# Patient Record
Sex: Female | Born: 1980 | Hispanic: No | Marital: Single | Smoking: Never smoker
Health system: Southern US, Community
[De-identification: ages and names within clinical notes are randomized; demographics above are authoritative.]

## PROBLEM LIST (undated history)

## (undated) DIAGNOSIS — Z789 Other specified health status: Secondary | ICD-10-CM

## (undated) HISTORY — PX: NO PAST SURGERIES: SHX2092

---

## 2003-01-23 ENCOUNTER — Inpatient Hospital Stay (HOSPITAL_COMMUNITY): Admission: AD | Admit: 2003-01-23 | Discharge: 2003-01-23 | Payer: Self-pay | Admitting: Obstetrics & Gynecology

## 2003-05-01 ENCOUNTER — Inpatient Hospital Stay (HOSPITAL_COMMUNITY): Admission: AD | Admit: 2003-05-01 | Discharge: 2003-05-01 | Payer: Self-pay | Admitting: Obstetrics & Gynecology

## 2003-07-30 ENCOUNTER — Ambulatory Visit (HOSPITAL_COMMUNITY): Admission: RE | Admit: 2003-07-30 | Discharge: 2003-07-30 | Payer: Self-pay | Admitting: *Deleted

## 2003-09-22 ENCOUNTER — Inpatient Hospital Stay (HOSPITAL_COMMUNITY): Admission: AD | Admit: 2003-09-22 | Discharge: 2003-09-22 | Payer: Self-pay | Admitting: Obstetrics and Gynecology

## 2003-09-22 ENCOUNTER — Inpatient Hospital Stay (HOSPITAL_COMMUNITY): Admission: AD | Admit: 2003-09-22 | Discharge: 2003-09-22 | Payer: Self-pay | Admitting: *Deleted

## 2009-01-31 ENCOUNTER — Ambulatory Visit (HOSPITAL_COMMUNITY): Admission: AD | Admit: 2009-01-31 | Discharge: 2009-01-31 | Payer: Self-pay | Admitting: Obstetrics & Gynecology

## 2009-01-31 ENCOUNTER — Ambulatory Visit: Payer: Self-pay | Admitting: Obstetrics & Gynecology

## 2009-01-31 ENCOUNTER — Encounter: Payer: Self-pay | Admitting: Obstetrics & Gynecology

## 2009-01-31 ENCOUNTER — Encounter: Payer: Self-pay | Admitting: Emergency Medicine

## 2009-02-19 ENCOUNTER — Ambulatory Visit: Payer: Self-pay | Admitting: Obstetrics and Gynecology

## 2010-09-25 LAB — DIFFERENTIAL
Basophils Absolute: 0 10*3/uL (ref 0.0–0.1)
Basophils Relative: 0 % (ref 0–1)
Eosinophils Absolute: 0.2 10*3/uL (ref 0.0–0.7)
Eosinophils Relative: 2 % (ref 0–5)
Lymphocytes Relative: 15 % (ref 12–46)
Lymphs Abs: 1.2 10*3/uL (ref 0.7–4.0)
Monocytes Absolute: 0.7 10*3/uL (ref 0.1–1.0)
Monocytes Relative: 8 % (ref 3–12)
Neutro Abs: 6.3 10*3/uL (ref 1.7–7.7)
Neutrophils Relative %: 75 % (ref 43–77)

## 2010-09-25 LAB — CBC
HCT: 38.7 % (ref 36.0–46.0)
Hemoglobin: 13.2 g/dL (ref 12.0–15.0)
MCHC: 34.1 g/dL (ref 30.0–36.0)
MCV: 92.8 fL (ref 78.0–100.0)
Platelets: 235 10*3/uL (ref 150–400)
RBC: 4.17 MIL/uL (ref 3.87–5.11)
RDW: 13.6 % (ref 11.5–15.5)
WBC: 8.4 10*3/uL (ref 4.0–10.5)

## 2010-09-26 LAB — GC/CHLAMYDIA PROBE AMP, GENITAL
Chlamydia, DNA Probe: NEGATIVE
GC Probe Amp, Genital: NEGATIVE

## 2010-09-26 LAB — CBC
HCT: 38.9 % (ref 36.0–46.0)
Hemoglobin: 12.8 g/dL (ref 12.0–15.0)
MCHC: 32.8 g/dL (ref 30.0–36.0)
MCV: 92.4 fL (ref 78.0–100.0)
Platelets: 238 10*3/uL (ref 150–400)
RBC: 4.21 MIL/uL (ref 3.87–5.11)
RDW: 13.7 % (ref 11.5–15.5)
WBC: 10.4 10*3/uL (ref 4.0–10.5)

## 2010-09-26 LAB — DIFFERENTIAL
Basophils Absolute: 0.1 10*3/uL (ref 0.0–0.1)
Basophils Relative: 1 % (ref 0–1)
Eosinophils Absolute: 0.2 10*3/uL (ref 0.0–0.7)
Eosinophils Relative: 2 % (ref 0–5)
Lymphocytes Relative: 17 % (ref 12–46)
Lymphs Abs: 1.8 10*3/uL (ref 0.7–4.0)
Monocytes Absolute: 0.7 10*3/uL (ref 0.1–1.0)
Monocytes Relative: 6 % (ref 3–12)
Neutro Abs: 7.8 10*3/uL — ABNORMAL HIGH (ref 1.7–7.7)
Neutrophils Relative %: 74 % (ref 43–77)

## 2010-09-26 LAB — ABO/RH: ABO/RH(D): A POS

## 2010-09-26 LAB — BASIC METABOLIC PANEL
BUN: 4 mg/dL — ABNORMAL LOW (ref 6–23)
CO2: 23 mEq/L (ref 19–32)
Calcium: 8.9 mg/dL (ref 8.4–10.5)
Chloride: 110 mEq/L (ref 96–112)
Creatinine, Ser: 0.46 mg/dL (ref 0.4–1.2)
GFR calc Af Amer: >60 mL/min (ref >60)
GFR calc non Af Amer: >60 mL/min (ref >60)
Glucose, Bld: 106 mg/dL — ABNORMAL HIGH (ref 70–99)
Potassium: 3.5 mEq/L (ref 3.5–5.1)
Sodium: 138 mEq/L (ref 135–145)

## 2010-09-26 LAB — WET PREP, GENITAL
Clue Cells Wet Prep HPF POC: NONE SEEN
Trich, Wet Prep: NONE SEEN
Yeast Wet Prep HPF POC: NONE SEEN

## 2010-09-26 LAB — POCT PREGNANCY, URINE: Preg Test, Ur: POSITIVE

## 2010-09-26 LAB — HCG, QUANTITATIVE, PREGNANCY: hCG, Beta Chain, Quant, S: 3362 m[IU]/mL — ABNORMAL HIGH (ref <5)

## 2010-11-02 NOTE — Op Note (Signed)
NAMEDESTYNIE, TOOMEY ACCOUNT NO.:  [id number]   MEDICAL RECORD NO.:  [id number]          PATIENT TYPE:  AMB   LOCATION:  SDC                           FACILITY:  [facility]   PHYSICIAN:  Scheryl Darter, MD       DATE OF BIRTH:  02/18/1981   DATE OF PROCEDURE:  01/31/2009  DATE OF DISCHARGE:  01/31/2009                               OPERATIVE REPORT   PROCEDURE:  Suction dilatation and curettage.   PREOPERATIVE DIAGNOSIS:  Fetal demise, 8-[redacted] weeks gestation.   POSTOPERATIVE DIAGNOSIS:  Fetal demise, 8-[redacted] weeks gestation.   SURGEON:  Scheryl Darter, MD   ANESTHESIA:  MAC with local.   SPECIMEN:  Products of conception.   ESTIMATED BLOOD LOSS:  100 mL.   COMPLICATIONS:  None.   DRAINS:  None.   COUNTS:  Correct.   HOSPITAL COURSE:  The patient gave written consent for suction  dilatation and curettage due to diagnosis of a fetal demise, 8-[redacted] weeks  gestation.  The patient identification was confirmed and she was brought  to the OR and the patient received IV sedation.  Perineum and vagina was  sterilely prepped and draped.  Exam revealed about an 9-week size  uterus, revealed a closed cervix.  Speculum was inserted and 0.5%  Marcaine with 1:200,000 epinephrine was infiltrated for a intracervical  block.  Cervix was grasped with single-tooth tenaculum.  The patient  received IV Pitocin during the procedure.  Uterus sounded to 10 cm.  Cervix dilated sufficiently to pass a 9 cm suction curette.  Suction  curettage was done and products of conception were obtained.  Curettage  was completed when complete evacuation of the uterine cavity was  assured.  There was minimal bleeding at the end of the procedure.  All  instruments were removed.  The patient tolerated the procedure well  without complications.  She was brought in stable condition to the  recovery room.  The patient's blood type is A+.       Scheryl Darter, MD  Electronically Signed    JA/MEDQ  D:  01/31/2009   T:  02/01/2009  Job:  [id number]

## 2011-11-01 ENCOUNTER — Encounter (INDEPENDENT_AMBULATORY_CARE_PROVIDER_SITE_OTHER): Payer: Self-pay

## 2011-11-17 ENCOUNTER — Ambulatory Visit (INDEPENDENT_AMBULATORY_CARE_PROVIDER_SITE_OTHER): Payer: Self-pay | Admitting: General Surgery

## 2011-11-17 VITALS — BP 110/68 | HR 70 | Temp 97.3°F | Resp 16 | Ht <= 58 in | Wt 124.4 lb

## 2011-11-17 DIAGNOSIS — N644 Mastodynia: Secondary | ICD-10-CM

## 2011-11-17 NOTE — Progress Notes (Signed)
Patient ID: Tina Carson, female   DOB: [birth year], 31 y.o.   MRN: [id number]  Chief Complaint  Patient presents with  . Mass    Evaluate right breast mass    HPI Tina Carson is a 31 y.o. female.   HPI This patient was referred by Hyacinth Meeker for evaluation of right breast pain and arm pain. She says that she has noted a mass in the outer quadrant of her breast for about 7-8 months but she says that this has not increased in size over this time period. She has had 2 ultrasounds of the area which demonstrate no suspicious masses. Her most recent ultrasound was February 25 which was BI-RADS one. She says that this mass fluctuates in size  And with her periods. She says this is also causes tenderness in her breast but shooting down her arm as well. She says that she does do self breast exams and has not noticed any other masses. She denies taking any hormones or oral contraceptives. Denies any family history of breast cancer and she began her periods at age 54 she did breast-feed with the birth of her child 8 years ago. She denies any other systemic symptoms.  No past medical history on file.  No past surgical history on file.  No family history on file.  Social History History  Substance Use Topics  . Smoking status: Not on file  . Smokeless tobacco: Not on file  . Alcohol Use: Not on file    Allergies not on file  No current outpatient prescriptions on file.    Review of Systems Review of Systems All other review of systems negative or noncontributory except as stated in the HPI  Blood pressure 110/68, pulse 70, temperature 97.3 F (36.3 C), temperature source Temporal, resp. rate 16, height 4\' 9"  (1.448 m), weight 124 lb 6.4 oz (56.427 kg).  Physical Exam Physical Exam  Nursing note and vitals reviewed. Constitutional: She is oriented to person, place, and time. She appears well-developed and well-nourished. No distress.  HENT:  Head:  Normocephalic and atraumatic.  Eyes: Conjunctivae are normal. Pupils are equal, round, and reactive to light.  Neck: Normal range of motion. No tracheal deviation present.  Cardiovascular: Normal rate.   Pulmonary/Chest: Effort normal. No respiratory distress.       Breast exam is essentially normal today. Left breast demonstrates fibrous breast tissue without any dominant masses. She has no lymphadenopathy or skin changes. Her right breast is fibrous as well she does have some nodularity in the outer quadrant of her right breast in the 9:00 10:00 positions in the area of concern but I do not appreciate any dominant masses, skin changes, or lymphadenopathy.  Abdominal: Soft. She exhibits no distension. There is no tenderness.  Musculoskeletal: Normal range of motion. She exhibits no edema.  Neurological: She is alert and oriented to person, place, and time.  Skin: Skin is warm and dry. She is not diaphoretic.  Psychiatric: She has a normal mood and affect. Her behavior is normal. Judgment and thought content normal.    Data Reviewed [place]   Assessment    Fibrocystic changes of the breast I do not appreciate any suspicious masses on exam and ultrasound performed in February was BI-RAD one. I think that her breast pain and nodularity is likely due to fibrocystic changes. I do not see any need for any surgical excision at this time. I recommended that we keep this with monthly self breast exams and followup  ultrasound and clinical exam in 4 months. If everything is stable at this time, and she should not need any further followup other than monthly self breast exams and her annual mammograms starting at age 24. Certainly, if things change in the mass increases in size and becomes more defined than I recommended that she come back sooner for repeat evaluation. Otherwise, evening primrose oil and vitamin E. may be helpful as well as a supportive bra.    Plan    Continue monthly self breast exams to  see her back in 4 months with repeat ultrasound and clinical exam and       Retta Pitcher DAVID 11/17/2011, 9:35 AM

## 2012-01-03 ENCOUNTER — Encounter (INDEPENDENT_AMBULATORY_CARE_PROVIDER_SITE_OTHER): Payer: Self-pay | Admitting: General Surgery

## 2013-08-09 DIAGNOSIS — R109 Unspecified abdominal pain: Secondary | ICD-10-CM | POA: Insufficient documentation

## 2013-08-09 DIAGNOSIS — O209 Hemorrhage in early pregnancy, unspecified: Secondary | ICD-10-CM | POA: Insufficient documentation

## 2013-08-10 ENCOUNTER — Encounter (HOSPITAL_COMMUNITY): Payer: Self-pay

## 2013-08-10 ENCOUNTER — Inpatient Hospital Stay (HOSPITAL_COMMUNITY)
Admission: AD | Admit: 2013-08-10 | Discharge: 2013-08-10 | Disposition: A | Discharge status: Home / Self Care | Payer: Self-pay | Source: Ambulatory Visit | Attending: Obstetrics & Gynecology | Admitting: Obstetrics & Gynecology

## 2013-08-10 ENCOUNTER — Inpatient Hospital Stay (HOSPITAL_COMMUNITY): Payer: Self-pay

## 2013-08-10 DIAGNOSIS — O209 Hemorrhage in early pregnancy, unspecified: Secondary | ICD-10-CM

## 2013-08-10 HISTORY — DX: Other specified health status: Z78.9

## 2013-08-10 LAB — URINALYSIS, ROUTINE W REFLEX MICROSCOPIC
Bilirubin Urine: NEGATIVE
Glucose, UA: NEGATIVE mg/dL
Ketones, ur: NEGATIVE mg/dL
Leukocytes, UA: NEGATIVE
Nitrite: NEGATIVE
Protein, ur: NEGATIVE mg/dL
Specific Gravity, Urine: <1.005 — ABNORMAL LOW (ref 1.005–1.030)
Urobilinogen, UA: 0.2 mg/dL (ref 0.0–1.0)
pH: 6.5 (ref 5.0–8.0)

## 2013-08-10 LAB — URINE MICROSCOPIC-ADD ON

## 2013-08-10 LAB — CBC
HCT: 38.5 % (ref 36.0–46.0)
Hemoglobin: 12.9 g/dL (ref 12.0–15.0)
MCH: 30 pg (ref 26.0–34.0)
MCHC: 33.5 g/dL (ref 30.0–36.0)
MCV: 89.5 fL (ref 78.0–100.0)
Platelets: 222 10*3/uL (ref 150–400)
RBC: 4.3 MIL/uL (ref 3.87–5.11)
RDW: 13.7 % (ref 11.5–15.5)
WBC: 8.8 10*3/uL (ref 4.0–10.5)

## 2013-08-10 LAB — WET PREP, GENITAL
Trich, Wet Prep: NONE SEEN
Yeast Wet Prep HPF POC: NONE SEEN

## 2013-08-10 LAB — GC/CHLAMYDIA PROBE AMP
CT Probe RNA: NEGATIVE
GC Probe RNA: NEGATIVE

## 2013-08-10 LAB — HCG, QUANTITATIVE, PREGNANCY: hCG, Beta Chain, Quant, S: 3423 m[IU]/mL — ABNORMAL HIGH (ref <5)

## 2013-08-10 LAB — POCT PREGNANCY, URINE: Preg Test, Ur: POSITIVE — AB

## 2013-08-10 NOTE — Discharge Instructions (Signed)
Vaginal Bleeding During Pregnancy, First Trimester A small amount of bleeding (spotting) from the vagina is relatively common in early pregnancy. It usually stops on its own. Various things may cause bleeding or spotting in early pregnancy. Some bleeding may be related to the pregnancy, and some may not. In most cases, the bleeding is normal and is not a problem. However, bleeding can also be a sign of something serious. Be sure to tell your health care provider about any vaginal bleeding right away. Some possible causes of vaginal bleeding during the first trimester include:  Infection or inflammation of the cervix.  Growths (polyps) on the cervix.  Miscarriage or threatened miscarriage.  Pregnancy tissue has developed outside of the uterus and in a fallopian tube (tubal pregnancy).  Tiny cysts have developed in the uterus instead of pregnancy tissue (molar pregnancy). HOME CARE INSTRUCTIONS  Watch your condition for any changes. The following actions may help to lessen any discomfort you are feeling:  Follow your health care provider's instructions for limiting your activity. If your health care provider orders bed rest, you may need to stay in bed and only get up to use the bathroom. However, your health care provider may allow you to continue light activity.  If needed, make plans for someone to help with your regular activities and responsibilities while you are on bed rest.  Keep track of the number of pads you use each day, how often you change pads, and how soaked (saturated) they are. Write this down.  Do not use tampons. Do not douche.  Do not have sexual intercourse or orgasms until approved by your health care provider.  If you pass any tissue from your vagina, save the tissue so you can show it to your health care provider.  Only take over-the-counter or prescription medicines as directed by your health care provider.  Do not take aspirin because it can make you  bleed.  Keep all follow-up appointments as directed by your health care provider. SEEK MEDICAL CARE IF:  You have any vaginal bleeding during any part of your pregnancy.  You have cramps or labor pains. SEEK IMMEDIATE MEDICAL CARE IF:   You have severe cramps in your back or belly (abdomen).  You have a fever, not controlled by medicine.  You pass large clots or tissue from your vagina.  Your bleeding increases.  You feel lightheaded or weak, or you have fainting episodes.  You have chills.  You are leaking fluid or have a gush of fluid from your vagina.  You pass out while having a bowel movement. MAKE SURE YOU:  Understand these instructions.  Will watch your condition.  Will get help right away if you are not doing well or get worse. Document Released: 03/16/2005 Document Revised: 03/27/2013 Document Reviewed: 02/11/2013 [facility] Patient Information 2014 [place], [place].  Hemorragia vaginal durante el embarazo (primer trimestre) (Vaginal Bleeding During Pregnancy, First Trimester) Durante los primeros meses del embarazo es relativamente frecuente que se presente una pequea hemorragia (manchas). Esta situacin generalmente mejora por s misma. Estas hemorragias o manchas tienen diversas causas al inicio del embarazo. Algunas manchas pueden estar relacionadas al [employer] y otras no. En la [employer], la hemorragia es normal y no es un problema. Sin embargo, la hemorragia tambin puede ser un signo de algo grave. Debe informar a su mdico de inmediato si tiene alguna hemorragia vaginal. Algunas causas posibles de hemorragia vaginal durante el primer trimestre incluyen:  Infeccin o inflamacin del cuello del  tero.  Crecimientos anormales (plipos) en el cuello del tero.  Aborto espontneo o amenaza de aborto espontneo.  Tejido del Psychiatristembarazo se ha desarrollado fuera del tero y en una trompa de falopio (embarazo ectpico).  Se han desarrollado pequeos  quistes en el tero en lugar de tejido de embarazo (embarazo molar). INSTRUCCIONES PARA EL CUIDADO EN EL HOGAR  Controle su afeccin para ver si hay cambios. Las siguientes indicaciones ayudarn a Psychologist, educationalaliviar cualquier Longs Drug Storesmolestia que pueda sentir:  Siga las indicaciones del mdico para restringir su actividad. Si el mdico le indica descanso en la cama, debe quedarse en la cama y levantarse solo para ir al bao. No obstante, el mdico puede permitirle que continu con tareas livianas.  Si es necesario, organcese para que alguien le ayude con las actividades y responsabilidades cotidianas mientras est en cama.  Lleve un registro de la cantidad y la saturacin de las toallas higinicas que Landscape architectutiliza cada da. Anote este dato.  No use tampones.No se haga duchas vaginales.  No tenga relaciones sexuales u orgasmos hasta que el mdico la autorice.  Si elimina tejido por la vagina, gurdelo para mostrrselo al American Expressmdico.  [place] solo medicamentos de venta libre o recetados, segn las indicaciones del mdico.  No tome aspirina, ya que puede causar hemorragias.  Cumpla con todas las visitas de control, segn le indique su mdico. SOLICITE ATENCIN MDICA SI:  Tiene un sangrado vaginal en cualquier momento del embarazo.  Tiene calambres o dolores de [place]. SOLICITE ATENCIN MDICA DE INMEDIATO SI:   Siente calambres intensos en la espalda o en el vientre (abdomen).  Tiene fiebre que los medicamentos no Sports coachpueden controlar.  Elimina cogulos grandes o tejido por la vagina.  La hemorragia aumenta.  Si se siente mareada, dbil o se desmaya.  Tiene escalofros.  Tiene una prdida importante o sale lquido a borbotones por la vagina.  Se desmaya mientras defeca. ASEGRESE DE QUE:  Comprende estas instrucciones.  Controlar su afeccin.  Recibir ayuda de inmediato si no mejora o si empeora. Document Released: 03/16/2005 Document Revised: 03/27/2013 [facility] Patient Information 2014 [place].

## 2013-08-10 NOTE — MAU Note (Signed)
Took home UPT 2 weeks ago and it was positive.Started having abd pain 2 hours ago and then 40 min ago, had pinkish d/c.

## 2013-08-10 NOTE — MAU Provider Note (Signed)
Chief Complaint: Abdominal Pain   None    SUBJECTIVE HPI: Tina Carson is a 33 y.o. G3P1011 at 2737w2d by LMP who presents to maternity admissions reporting positive pregnancy test 2 weeks ago, with abdominal cramping and pink spotting starting today.  She denies vaginal itching/burning, urinary symptoms, h/a, dizziness, n/v, or fever/chills.     Past Medical History  Diagnosis Date  . Medical history non-contributory    Past Surgical History  Procedure Laterality Date  . No past surgeries     History   Social History  . Marital Status: Married    Spouse Name: N/A    Number of Children: N/A  . Years of Education: N/A   Occupational History  . Not on file.   Social History Main Topics  . Smoking status: Never Smoker   . Smokeless tobacco: Never Used  . Alcohol Use: No  . Drug Use: No  . Sexual Activity: Yes   Other Topics Concern  . Not on file   Social History Narrative  . No narrative on file   No current facility-administered medications on file prior to encounter.   No current outpatient prescriptions on file prior to encounter.   No Known Allergies  ROS: Pertinent items in HPI  OBJECTIVE Blood pressure 95/56, pulse 75, temperature 98.8 F (37.1 C), temperature source Oral, resp. rate 18, height 4\' 9"  (1.448 m), weight 59.875 kg (132 lb), last menstrual period 05/23/2013. GENERAL: Well-developed, well-nourished female in no acute distress.  HEENT: Normocephalic HEART: normal rate RESP: normal effort ABDOMEN: Soft, non-tender EXTREMITIES: Nontender, no edema NEURO: Alert and oriented Pelvic exam: Cervix pink, visually closed, without lesion, small amount dark red blood in vaginal vault, vaginal walls and external genitalia normal Bimanual exam: Cervix 0/long/high, firm, anterior, neg CMT, uterus nontender, nonenlarged, adnexa without tenderness, enlargement, or mass  LAB RESULTS URINALYSIS, ROUTINE W REFLEX MICROSCOPIC   Collection Time     08/10/13 12:00 AM      Result Value Ref Range   Color, Urine YELLOW  YELLOW   APPearance CLEAR  CLEAR   Specific Gravity, Urine <1.005 (*) 1.005 - 1.030   pH 6.5  5.0 - 8.0   Glucose, UA NEGATIVE  NEGATIVE mg/dL   Hgb urine dipstick LARGE (*) NEGATIVE   Bilirubin Urine NEGATIVE  NEGATIVE   Ketones, ur NEGATIVE  NEGATIVE mg/dL   Protein, ur NEGATIVE  NEGATIVE mg/dL   Urobilinogen, UA 0.2  0.0 - 1.0 mg/dL   Nitrite NEGATIVE  NEGATIVE   Leukocytes, UA NEGATIVE  NEGATIVE  URINE MICROSCOPIC-ADD ON   Collection Time    08/10/13 12:00 AM      Result Value Ref Range   Squamous Epithelial / LPF RARE  RARE   WBC, UA 0-2  <3 WBC/hpf   RBC / HPF 3-6  <3 RBC/hpf   Bacteria, UA FEW (*) RARE   Urine-Other AMORPHOUS URATES/PHOSPHATES    WET PREP, GENITAL   Collection Time    08/10/13 12:45 AM      Result Value Ref Range   Yeast Wet Prep HPF POC NONE SEEN  NONE SEEN   Trich, Wet Prep NONE SEEN  NONE SEEN   Clue Cells Wet Prep HPF POC FEW (*) NONE SEEN   WBC, Wet Prep HPF POC MODERATE (*) NONE SEEN  GC/CHLAMYDIA PROBE AMP   Collection Time    08/10/13 12:45 AM      Result Value Ref Range   CT Probe RNA NEGATIVE  NEGATIVE  GC Probe RNA NEGATIVE  NEGATIVE  CBC   Collection Time    08/10/13 12:55 AM      Result Value Ref Range   WBC 8.8  4.0 - 10.5 K/uL   RBC 4.30  3.87 - 5.11 MIL/uL   Hemoglobin 12.9  12.0 - 15.0 g/dL   HCT 16.1  09.6 - 04.5 %   MCV 89.5  78.0 - 100.0 fL   MCH 30.0  26.0 - 34.0 pg   MCHC 33.5  30.0 - 36.0 g/dL   RDW 40.9  81.1 - 91.4 %   Platelets 222  150 - 400 K/uL  HCG, QUANTITATIVE, PREGNANCY   Collection Time    08/10/13 12:55 AM      Result Value Ref Range   hCG, Beta Chain, Quant, S 3423 (*) <5 mIU/mL  POCT PREGNANCY, URINE   Collection Time    08/10/13 12:59 AM      Result Value Ref Range   Preg Test, Ur POSITIVE (*) NEGATIVE     IMAGING CLINICAL DATA: Vaginal bleeding.  EXAM:  OBSTETRIC <14 WK ULTRASOUND  TRANSVAGINAL OB ULTRASOUND   TECHNIQUE:  Transabdominal and transvaginal ultrasound was performed for  evaluation of the gestation as well as the maternal uterus and  adnexal regions.  COMPARISON: Prior ultrasound of pregnancy performed 01/31/2009  FINDINGS:  Intrauterine gestational sac: Visualized/normal in shape.  Yolk sac: Yes  Embryo: No  Cardiac Activity: N/A  MSD: 8.2 mm 5 w 3 d  Maternal uterus/adnexae: No subchorionic hemorrhage is noted. The  uterus is otherwise unremarkable in appearance.  The ovaries are unremarkable in appearance. The right ovary measures  2.8 x 1.4 x 1.7 cm, while the left ovary measures 2.8 x 1.4 x 1.8  cm. No suspicious adnexal masses are seen; there is no evidence for  ovarian torsion.  No free fluid is seen in the pelvic cul-de-sac.  IMPRESSION:  Single intrauterine gestational sac noted, with an associated yolk  sac. The embryo is not yet seen. The mean sac diameter of 8 mm  corresponds to a gestational age of [redacted] weeks 3 days; this does not  match the gestational age by LMP, and reflects an estimated date of  delivery of April 09, 2014.  Electronically Signed  By: Roanna Raider M.D.  On: 08/10/2013 01:35    ASSESSMENT 1. Vaginal bleeding in pregnant patient at less than [redacted] weeks gestation     PLAN Discharge home with bleeding precautions F/U with early prenatal care Return to MAU as needed    Medication List    Notice   You have not been prescribed any medications.     Follow-up Information   Follow up with [facility] [place]. (Or prenatal provider of your choice.)    Contact information:   [address] [place] [address] [phone]      Follow up with [facility] [place] [facility]. (If symptoms worsen)    Contact information:   [address] [id number] [place] [address] [phone]      Sharen Counter Certified Nurse-Midwife 08/12/2013  7:10 PM

## 2013-08-11 ENCOUNTER — Inpatient Hospital Stay (HOSPITAL_COMMUNITY)
Admission: AD | Admit: 2013-08-11 | Discharge: 2013-08-12 | Disposition: A | Discharge status: Home / Self Care | Payer: Self-pay | Source: Ambulatory Visit | Attending: Obstetrics & Gynecology | Admitting: Obstetrics & Gynecology

## 2013-08-11 ENCOUNTER — Encounter (HOSPITAL_COMMUNITY): Payer: Self-pay | Admitting: *Deleted

## 2013-08-11 ENCOUNTER — Inpatient Hospital Stay (HOSPITAL_COMMUNITY): Payer: Self-pay

## 2013-08-11 DIAGNOSIS — O039 Complete or unspecified spontaneous abortion without complication: Secondary | ICD-10-CM | POA: Insufficient documentation

## 2013-08-11 DIAGNOSIS — R109 Unspecified abdominal pain: Secondary | ICD-10-CM | POA: Insufficient documentation

## 2013-08-11 LAB — CBC
HCT: 37.1 % (ref 36.0–46.0)
Hemoglobin: 12.6 g/dL (ref 12.0–15.0)
MCH: 30.2 pg (ref 26.0–34.0)
MCHC: 34 g/dL (ref 30.0–36.0)
MCV: 89 fL (ref 78.0–100.0)
PLATELETS: 220 10*3/uL (ref 150–400)
RBC: 4.17 MIL/uL (ref 3.87–5.11)
RDW: 13.7 % (ref 11.5–15.5)
WBC: 8.6 10*3/uL (ref 4.0–10.5)

## 2013-08-11 LAB — HCG, QUANTITATIVE, PREGNANCY: HCG, BETA CHAIN, QUANT, S: 570 m[IU]/mL — AB (ref <5)

## 2013-08-11 LAB — ABO/RH: ABO/RH(D): A POS

## 2013-08-11 NOTE — MAU Provider Note (Signed)
History     CSN: [id number]  Arrival date and time: 08/11/13 2232   First Provider Initiated Contact with Patient 08/11/13 2300      Chief Complaint  Patient presents with  . Abdominal Cramping  . Vaginal Bleeding   Abdominal Cramping  Vaginal Bleeding    Tina Carson is a 33 y.o. G3P1011 at 5998w4d who presents today with vaginal bleeding. She states that she was seen on 08/10/13 and had spotting at that time. Everything was normal at that visit (IUP with yolk sac). However, she has continued to bleed. Today is has been very heavy and she has been passing large clots.   Past Medical History  Diagnosis Date  . Medical history non-contributory     Past Surgical History  Procedure Laterality Date  . No past surgeries      Family History  Problem Relation Age of Onset  . Hypertension Maternal Grandmother     History  Substance Use Topics  . Smoking status: Never Smoker   . Smokeless tobacco: Never Used  . Alcohol Use: No    Allergies: No Known Allergies  No prescriptions prior to admission    Review of Systems  Genitourinary: Positive for vaginal bleeding.   Physical Exam   Blood pressure 104/67, pulse 67, temperature 97.8 F (36.6 C), temperature source Oral, resp. rate 16, height 4\' 9"  (1.448 m), weight 59.421 kg (131 lb), last menstrual period 05/23/2013.  Physical Exam  Nursing note and vitals reviewed. Constitutional: She is oriented to person, place, and time. She appears well-developed and well-nourished. No distress.  Cardiovascular: Normal rate.   Respiratory: Effort normal.  GI: Soft. There is no tenderness.  Genitourinary:   External: no lesion Vagina: large amount of blood and clot removed from the vagina.  Cervix: pink, smooth, no CMT Uterus: NSSC Adnexa: NT   Neurological: She is alert and oriented to person, place, and time.  Skin: Skin is warm and dry.  Psychiatric: She has a normal mood and affect.    MAU Course   Procedures  Results for Enrigue CatenaRODRIGUEZ-ESPINDOLA, Artice V (MRN [id number]) as of 08/12/2013 00:23  Ref. Range 08/10/2013 00:55 08/10/2013 00:59 08/10/2013 01:21 08/11/2013 23:20  hCG, Beta Chain, Quant, S Latest Range: <5 mIU/mL 3423 (H)   570 (H)   Results for orders placed during the hospital encounter of 08/11/13 (from the past 24 hour(s))  CBC     Status: None   Collection Time    08/11/13 11:20 PM      Result Value Ref Range   WBC 8.6  4.0 - 10.5 K/uL   RBC 4.17  3.87 - 5.11 MIL/uL   Hemoglobin 12.6  12.0 - 15.0 g/dL   HCT 86.537.1  78.436.0 - 69.646.0 %   MCV 89.0  78.0 - 100.0 fL   MCH 30.2  26.0 - 34.0 pg   MCHC 34.0  30.0 - 36.0 g/dL   RDW 29.513.7  28.411.5 - 13.215.5 %   Platelets 220  150 - 400 K/uL  ABO/RH     Status: None   Collection Time    08/11/13 11:20 PM      Result Value Ref Range   ABO/RH(D) A POS    HCG, QUANTITATIVE, PREGNANCY     Status: Abnormal   Collection Time    08/11/13 11:20 PM      Result Value Ref Range   hCG, Beta Chain, Quant, S 570 (*) <5 mIU/mL   D/W the patient and her husband at  length that she has had a miscarriage, and has lost the baby. Reviewed bleeding precautions and when to return to MAU. Otherwise will FU in the clinic in 2 weeks for FU HCG. Comfort pack given.   Assessment and Plan   1. SAB (spontaneous abortion)    Bleeding precautions Return to MAU as needed  Follow-up Information   Follow up with [facility] In 2 weeks.   Specialty:  Obstetrics and Gynecology   Contact information:   [address] [place] [address] [phone]       Tawnya Crook 08/11/2013, 11:42 PM

## 2013-08-11 NOTE — MAU Note (Signed)
Pt with cramping and bleeding since Friday.  Bleeding is heavier today.

## 2013-08-12 ENCOUNTER — Encounter: Payer: Self-pay | Admitting: *Deleted

## 2013-08-12 DIAGNOSIS — O039 Complete or unspecified spontaneous abortion without complication: Secondary | ICD-10-CM

## 2013-08-12 MED ORDER — HYDROCODONE-ACETAMINOPHEN 5-325 MG PO TABS: 1.0000 | ORAL_TABLET | Freq: Four times a day (QID) | ORAL | Status: DC | PRN

## 2013-08-12 MED ORDER — PROMETHAZINE HCL 12.5 MG PO TABS: 12.5000 mg | ORAL_TABLET | Freq: Four times a day (QID) | ORAL | Status: DC | PRN

## 2013-08-12 NOTE — Discharge Instructions (Signed)
Aborto espontáneo  °(Miscarriage) °El aborto espontáneo es la pérdida de un bebé que no ha nacido (feto) antes de la semana 20 del embarazo. La mayor parte de estos abortos ocurre en los primeros 3 meses. En algunos casos ocurre antes de que la mujer sepa que está embarazada. También se denomina "aborto espontáneo" o "pérdida prematura del embarazo". El aborto espontáneo puede ser una experiencia que afecte emocionalmente a la persona. Converse con su médico si tiene dudas, cómo es el proceso de duelo, y sobre planes futuros de embarazo.  °CAUSAS  °· Algunos problemas cromosómicos pueden hacer imposible que el bebé se desarrolle normalmente. Los problemas con los genes o cromosomas del bebé son generalmente el resultado de errores que se producen, por casualidad, cuando el embrión se divide y crece. Estos problemas no se heredan de los padres. °· Infección en el cuello del útero.   °· Problemas hormonales.   °· Problemas en el cuello del útero, como tener un útero incompetente. Esto ocurre cuando los tejidos no son lo suficientemente fuertes como para contener el embarazo.   °· Problemas del útero, como un útero con forma anormal, los fibromas o anormalidades congénitas.   °· Ciertas enfermedades crónicas.   °· No fume, no beba alcohol, ni consuma drogas.   °· Traumatismos   °A veces, la causa es desconocida.  °SÍNTOMAS  °· Sangrado o manchado vaginal, con o sin cólicos o dolor. °· Dolor o cólicos en el abdomen o en la cintura. °· Eliminación de líquido, tejidos o coágulos grandes por la vagina. °DIAGNÓSTICO  °El médico le hará un examen físico. También le indicará una ecografía para confirmar el aborto. Es posible que se realicen análisis de sangre.  °TRATAMIENTO  °· En algunos casos el tratamiento no es necesario, si se eliminan naturalmente todos los tejidos embrionarios que se encontraban en el útero. Si el feto o la placenta quedan dentro del útero (aborto incompleto), pueden infectarse, los tejidos que quedan  pueden infectarse y deben retirarse. Generalmente se realiza un procedimiento de dilatación y curetaje (D y C). Durante el procedimiento de dilatación y curetaje, el cuello del útero se abre (dilata) y se retira cualquier resto de tejido fetal o placentario del útero. °· Si hay una infección, le recetarán antibióticos. Podrán recetarle otros medicamentos para reducir el tamaño del útero (contraerlo) si hay una mucho sangrado. °· Si su sangre es Rh negativa y su bebé es Rh positivo, usted necesitará la inyección de inmunoglobulina Rh. Esta inyección protegerá a los futuros bebés de tener problemas de compatibilidad Rh en futuros embarazos. °INSTRUCCIONES PARA EL CUIDADO EN EL HOGAR  °· El médico le indicará reposo en cama o le permitirá realizar actividades livianas. Vuelva a la actividad lentamente o según las indicaciones de su médico. °· Pídale a alguien que la ayude con las responsabilidades familiares y del hogar durante este tiempo.   °· Lleve un registro de la cantidad y la saturación de las toallas higiénicas que utiliza cada día. Anote esta información   °· No use tampones. No No se haga duchas vaginales ni tenga relaciones sexuales hasta que el médico la autorice.   °· Sólo tome medicamentos de venta libre o recetados para calmar el dolor o el malestar, según las indicaciones de su médico.   °· No tome aspirina. La aspirina puede ocasionar hemorragias.   °· Concurra puntualmente a las citas de control con el médico.   °· Si usted o su pareja tienen dificultades con el duelo, hable con su médico para buscar la ayuda psicológica que los ayude a enfrentar la pérdida   del embarazo. Permítase el tiempo suficiente de duelo antes de quedar embarazada nuevamente.   °SOLICITE ATENCIÓN MÉDICA DE INMEDIATO SI:  °· Siente calambres intensos o dolor en la espalda o en el abdomen. °· Tiene fiebre. °· Elimina grandes coágulos de sangre (del tamaño de una nuez o más) o tejidos por la vagina. Guarde lo que ha eliminado para  que su médico lo examine.   °· La hemorragia aumenta.   °· Observa una secreción vaginal espesa y con mal olor. °· Se siente mareada, débil, o se desmaya.   °· Siente escalofríos.   °ASEGÚRESE DE QUE:  °· Comprende estas instrucciones. °· Controlará su enfermedad. °· Solicitará ayuda de inmediato si no mejora o si empeora. °Document Released: 03/16/2005 Document Revised: 10/01/2012 °ExitCare® Patient Information ©2014 [employer]. ° °

## 2013-08-20 NOTE — MAU Provider Note (Signed)
Attestation of Attending Supervision of Advanced Practitioner (CNM/NP): Evaluation and management procedures were performed by the Advanced Practitioner under my supervision and collaboration. I have reviewed the Advanced Practitioner's note and chart, and I agree with the management and plan.  Rydge Texidor H. 10:37 PM

## 2013-08-26 ENCOUNTER — Encounter: Payer: Self-pay | Admitting: Obstetrics & Gynecology

## 2013-08-26 ENCOUNTER — Ambulatory Visit (INDEPENDENT_AMBULATORY_CARE_PROVIDER_SITE_OTHER): Payer: Self-pay | Admitting: Obstetrics & Gynecology

## 2013-08-26 VITALS — BP 107/74 | HR 81 | Temp 98.0°F | Ht <= 58 in | Wt 130.5 lb

## 2013-08-26 DIAGNOSIS — O039 Complete or unspecified spontaneous abortion without complication: Secondary | ICD-10-CM

## 2013-08-26 MED ORDER — PRENATAL VITAMINS 0.8 MG PO TABS: 1.0000 | ORAL_TABLET | Freq: Every day | ORAL | Status: AC

## 2013-08-26 NOTE — Patient Instructions (Signed)
Aborto espontáneo  °(Miscarriage) °El aborto espontáneo es la pérdida de un bebé que no ha nacido (feto) antes de la semana 20 del embarazo. La mayor parte de estos abortos ocurre en los primeros 3 meses. En algunos casos ocurre antes de que la mujer sepa que está embarazada. También se denomina "aborto espontáneo" o "pérdida prematura del embarazo". El aborto espontáneo puede ser una experiencia que afecte emocionalmente a la persona. Converse con su médico si tiene dudas, cómo es el proceso de duelo, y sobre planes futuros de embarazo.  °CAUSAS  °· Algunos problemas cromosómicos pueden hacer imposible que el bebé se desarrolle normalmente. Los problemas con los genes o cromosomas del bebé son generalmente el resultado de errores que se producen, por casualidad, cuando el embrión se divide y crece. Estos problemas no se heredan de los padres. °· Infección en el cuello del útero.   °· Problemas hormonales.   °· Problemas en el cuello del útero, como tener un útero incompetente. Esto ocurre cuando los tejidos no son lo suficientemente fuertes como para contener el embarazo.   °· Problemas del útero, como un útero con forma anormal, los fibromas o anormalidades congénitas.   °· Ciertas enfermedades crónicas.   °· No fume, no beba alcohol, ni consuma drogas.   °· Traumatismos   °A veces, la causa es desconocida.  °SÍNTOMAS  °· Sangrado o manchado vaginal, con o sin cólicos o dolor. °· Dolor o cólicos en el abdomen o en la cintura. °· Eliminación de líquido, tejidos o coágulos grandes por la vagina. °DIAGNÓSTICO  °El médico le hará un examen físico. También le indicará una ecografía para confirmar el aborto. Es posible que se realicen análisis de sangre.  °TRATAMIENTO  °· En algunos casos el tratamiento no es necesario, si se eliminan naturalmente todos los tejidos embrionarios que se encontraban en el útero. Si el feto o la placenta quedan dentro del útero (aborto incompleto), pueden infectarse, los tejidos que quedan  pueden infectarse y deben retirarse. Generalmente se realiza un procedimiento de dilatación y curetaje (D y C). Durante el procedimiento de dilatación y curetaje, el cuello del útero se abre (dilata) y se retira cualquier resto de tejido fetal o placentario del útero. °· Si hay una infección, le recetarán antibióticos. Podrán recetarle otros medicamentos para reducir el tamaño del útero (contraerlo) si hay una mucho sangrado. °· Si su sangre es Rh negativa y su bebé es Rh positivo, usted necesitará la inyección de inmunoglobulina Rh. Esta inyección protegerá a los futuros bebés de tener problemas de compatibilidad Rh en futuros embarazos. °INSTRUCCIONES PARA EL CUIDADO EN EL HOGAR  °· El médico le indicará reposo en cama o le permitirá realizar actividades livianas. Vuelva a la actividad lentamente o según las indicaciones de su médico. °· Pídale a alguien que la ayude con las responsabilidades familiares y del hogar durante este tiempo.   °· Lleve un registro de la cantidad y la saturación de las toallas higiénicas que utiliza cada día. Anote esta información   °· No use tampones. No No se haga duchas vaginales ni tenga relaciones sexuales hasta que el médico la autorice.   °· Sólo tome medicamentos de venta libre o recetados para calmar el dolor o el malestar, según las indicaciones de su médico.   °· No tome aspirina. La aspirina puede ocasionar hemorragias.   °· Concurra puntualmente a las citas de control con el médico.   °· Si usted o su pareja tienen dificultades con el duelo, hable con su médico para buscar la ayuda psicológica que los ayude a enfrentar la pérdida   del embarazo. Permítase el tiempo suficiente de duelo antes de quedar embarazada nuevamente.   °SOLICITE ATENCIÓN MÉDICA DE INMEDIATO SI:  °· Siente calambres intensos o dolor en la espalda o en el abdomen. °· Tiene fiebre. °· Elimina grandes coágulos de sangre (del tamaño de una nuez o más) o tejidos por la vagina. Guarde lo que ha eliminado para  que su médico lo examine.   °· La hemorragia aumenta.   °· Observa una secreción vaginal espesa y con mal olor. °· Se siente mareada, débil, o se desmaya.   °· Siente escalofríos.   °ASEGÚRESE DE QUE:  °· Comprende estas instrucciones. °· Controlará su enfermedad. °· Solicitará ayuda de inmediato si no mejora o si empeora. °Document Released: 03/16/2005 Document Revised: 10/01/2012 °ExitCare® Patient Information ©2014 [employer]. ° °

## 2013-08-26 NOTE — Progress Notes (Signed)
Subjective:     Patient ID: Tina Carson, female   DOB: 05/10/1981, 33 y.o.   MRN: [id number]  HPI  Pt presents for eval of SAB.  She was seen in the MAU 2/21 and 2/22 and had HCG 3423 and 570 respectively.  She denies bleeding for 2 weeks.  She denies pain.  She does not want contraception as she desires to conceive.  Review of Systems     Objective:   Physical Exam BP 107/74  Pulse 81  Temp(Src) 98 F (36.7 C)  Ht 4\' 9"  (1.448 m)  Wt 130 lb 8 oz (59.194 kg)  BMI 28.23 kg/m2  LMP 05/23/2013  Breastfeeding? Unknown  Pt in NAD Abd: soft, NT, ND     Assessment:     SAB - recommend not getting pregnant for 3 months.    Plan:     Prenatal vit 1 PO q day F/u prn

## 2013-11-13 ENCOUNTER — Encounter: Payer: Self-pay | Admitting: General Practice

## 2014-03-31 ENCOUNTER — Other Ambulatory Visit (HOSPITAL_COMMUNITY): Payer: Self-pay | Admitting: Physician Assistant

## 2014-03-31 DIAGNOSIS — Z3689 Encounter for other specified antenatal screening: Secondary | ICD-10-CM

## 2014-03-31 LAB — OB RESULTS CONSOLE HIV ANTIBODY (ROUTINE TESTING)
HIV: NONREACTIVE
HIV: NONREACTIVE
HIV: NONREACTIVE

## 2014-03-31 LAB — OB RESULTS CONSOLE RPR
RPR: NONREACTIVE
RPR: NONREACTIVE
RPR: NONREACTIVE

## 2014-03-31 LAB — OB RESULTS CONSOLE GC/CHLAMYDIA
Chlamydia: NEGATIVE
GC PROBE AMP, GENITAL: NEGATIVE

## 2014-03-31 LAB — OB RESULTS CONSOLE HEPATITIS B SURFACE ANTIGEN: Hepatitis B Surface Ag: NEGATIVE

## 2014-03-31 LAB — OB RESULTS CONSOLE RUBELLA ANTIBODY, IGM: RUBELLA: IMMUNE

## 2014-04-21 ENCOUNTER — Encounter: Payer: Self-pay | Admitting: Obstetrics & Gynecology

## 2014-04-22 ENCOUNTER — Ambulatory Visit (HOSPITAL_COMMUNITY)
Admission: RE | Admit: 2014-04-22 | Discharge: 2014-04-22 | Disposition: A | Discharge status: Home / Self Care | Payer: Self-pay | Source: Ambulatory Visit | Attending: Physician Assistant | Admitting: Physician Assistant

## 2014-04-22 DIAGNOSIS — Z3A18 18 weeks gestation of pregnancy: Secondary | ICD-10-CM | POA: Insufficient documentation

## 2014-04-22 DIAGNOSIS — Z36 Encounter for antenatal screening of mother: Secondary | ICD-10-CM | POA: Insufficient documentation

## 2014-04-22 DIAGNOSIS — Z3689 Encounter for other specified antenatal screening: Secondary | ICD-10-CM | POA: Insufficient documentation

## 2014-05-12 ENCOUNTER — Other Ambulatory Visit (HOSPITAL_COMMUNITY): Payer: Self-pay | Admitting: Nurse Practitioner

## 2014-05-12 DIAGNOSIS — Z3689 Encounter for other specified antenatal screening: Secondary | ICD-10-CM

## 2014-05-20 ENCOUNTER — Ambulatory Visit (HOSPITAL_COMMUNITY)
Admission: RE | Admit: 2014-05-20 | Discharge: 2014-05-20 | Disposition: A | Discharge status: Home / Self Care | Payer: Self-pay | Source: Ambulatory Visit | Attending: Nurse Practitioner | Admitting: Nurse Practitioner

## 2014-05-20 DIAGNOSIS — Z36 Encounter for antenatal screening of mother: Secondary | ICD-10-CM | POA: Insufficient documentation

## 2014-05-20 DIAGNOSIS — Z3689 Encounter for other specified antenatal screening: Secondary | ICD-10-CM

## 2014-05-20 DIAGNOSIS — Z3A22 22 weeks gestation of pregnancy: Secondary | ICD-10-CM | POA: Insufficient documentation

## 2014-05-21 DIAGNOSIS — Z0489 Encounter for examination and observation for other specified reasons: Secondary | ICD-10-CM | POA: Insufficient documentation

## 2014-05-21 DIAGNOSIS — Z3A22 22 weeks gestation of pregnancy: Secondary | ICD-10-CM | POA: Insufficient documentation

## 2014-05-21 DIAGNOSIS — IMO0002 Reserved for concepts with insufficient information to code with codable children: Secondary | ICD-10-CM | POA: Insufficient documentation

## 2014-06-20 NOTE — L&D Delivery Note (Signed)
Patient is 34 y.o. [id number] 6968w3d admitted for onset of labor, no pertinent medical hx   Delivery Note At 6:04 PM a viable female was delivered via  (Presentation: occiput anterior).  APGAR: 7, 9; weight  pending.   Placenta status: intact.  Cord: 3 vessels.   Anesthesia: epidural  Episiotomy:  n/a Lacerations:  Small superficial tear Suture Repair: none Est. Blood Loss (mL): 250cc     NICU provider at bedside in setting of meconium stained fluid.  Baby immediately placed under warmer and bulb suctioned.  Please see NICU note. Mom to postpartum.  Baby to Couplet care / Skin to Skin. Placenta to: BS Feeding: Breast Circ: None Contraception: Ermelinda DasUndecided   Gottschalk, Ashly M, DO 09/25/2014, 6:43 PM  I was present for the entire delivery of baby and placenta and agree with above.  [place] Te[place] M[place], [place] 09/25/2014 7:09 PM

## 2014-08-20 IMAGING — US US OB COMP LESS 14 WK
1 series · 14 of 28 positions shown · non-contrast
Comparison: Prior ultrasound of pregnancy performed 01/31/2009

CLINICAL DATA: Vaginal bleeding.

EXAM:
OBSTETRIC <14 WK ULTRASOUND
TRANSVAGINAL OB ULTRASOUND
TECHNIQUE: Transabdominal and transvaginal ultrasound was performed for
evaluation of the gestation as well as the maternal uterus and
adnexal regions.

[Series 1: us ob comp less 14 wks · 14 of 48 slices shown]
[im 2/48]
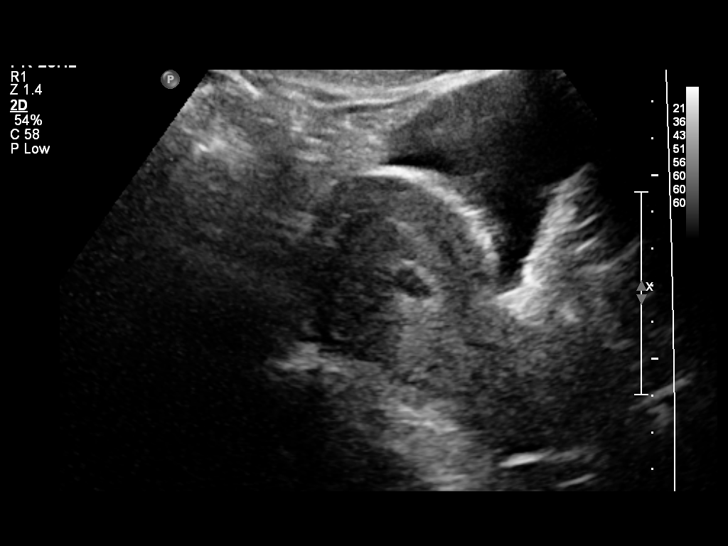
[im 6/48]
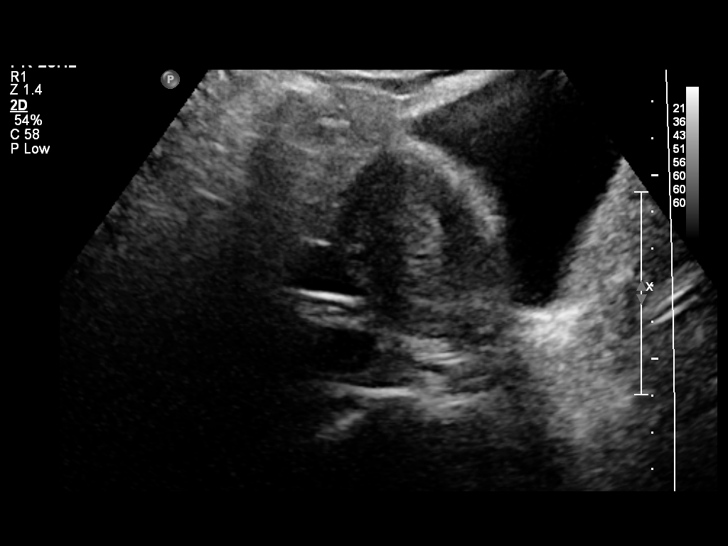
[im 9/48]
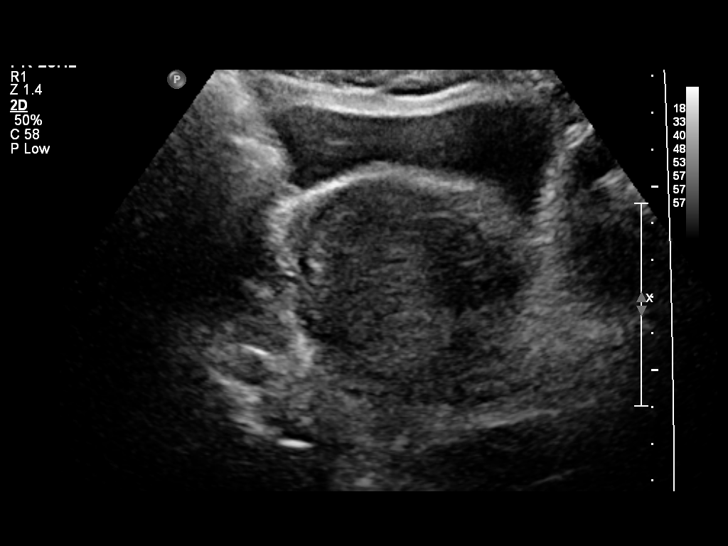
[im 13/48]
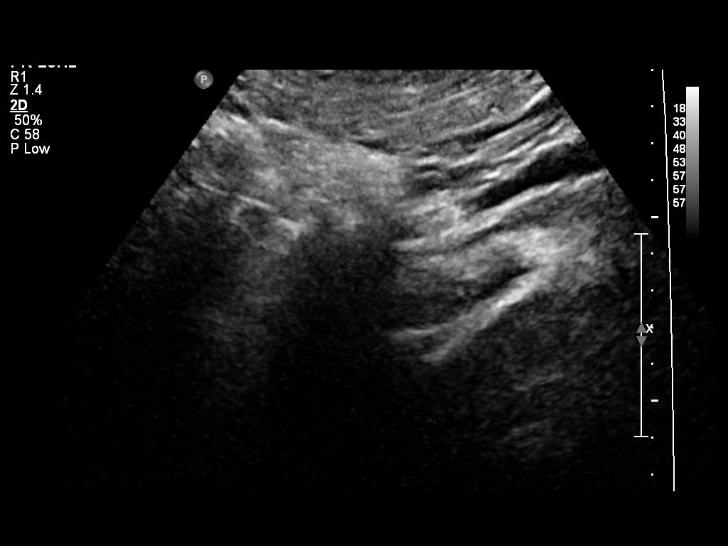
[im 16/48]
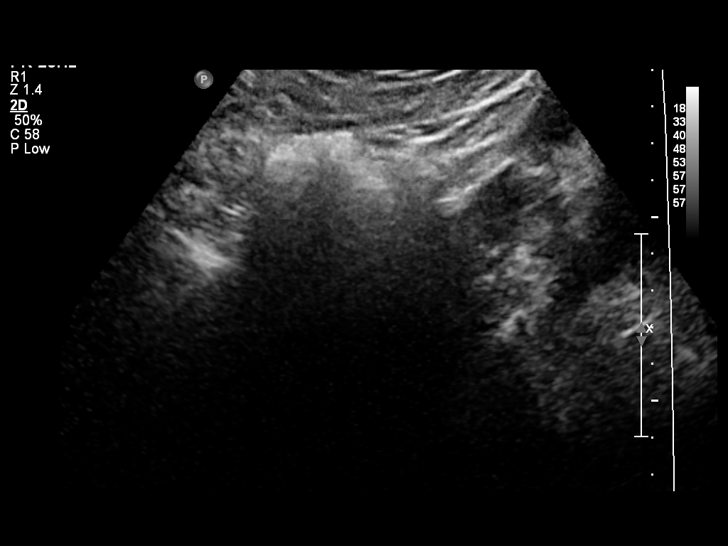
[im 20/48]
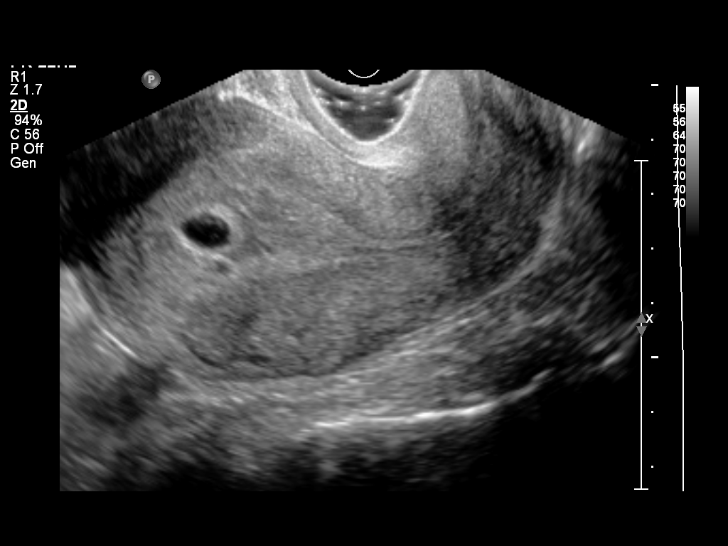
[im 23/48]
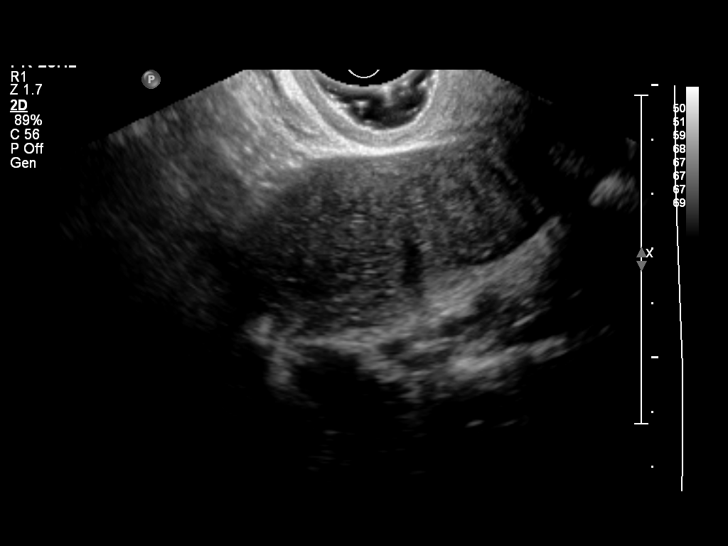
[im 27/48]
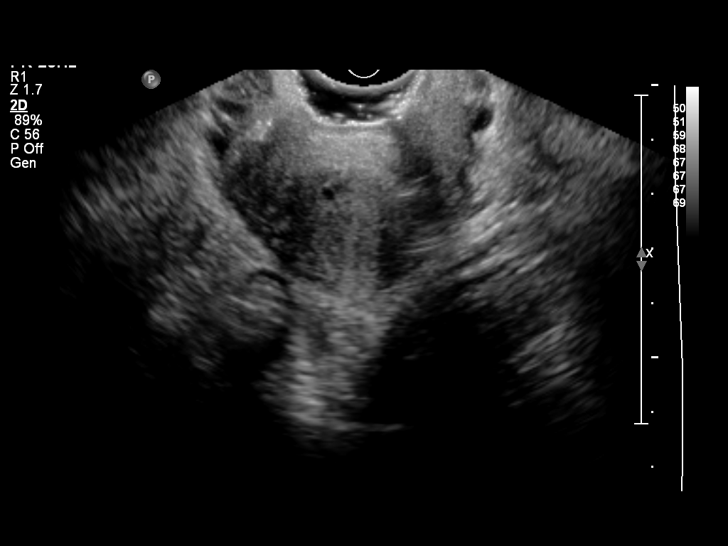
[im 30/48]
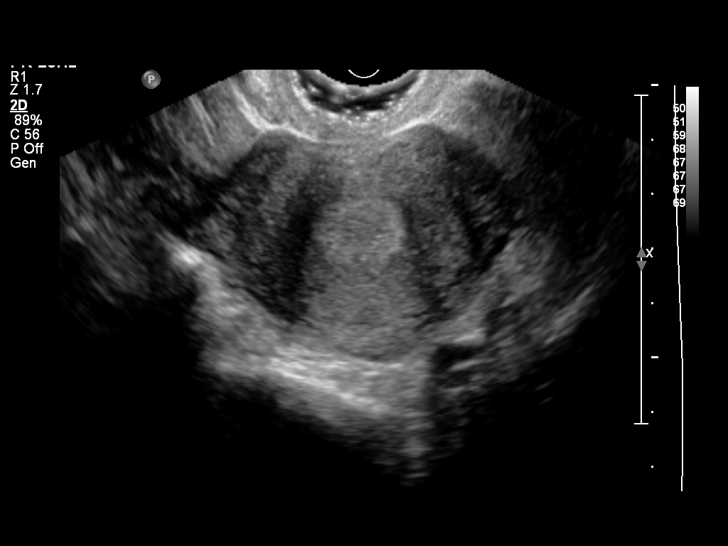
[im 34/48]
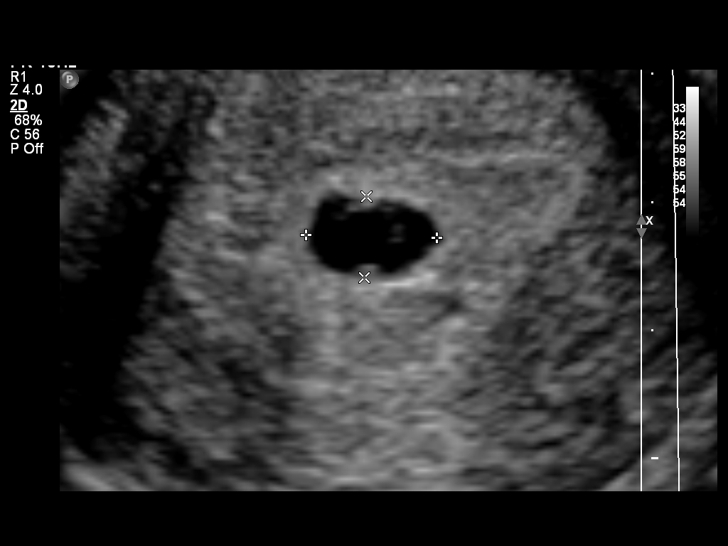
[im 37/48]
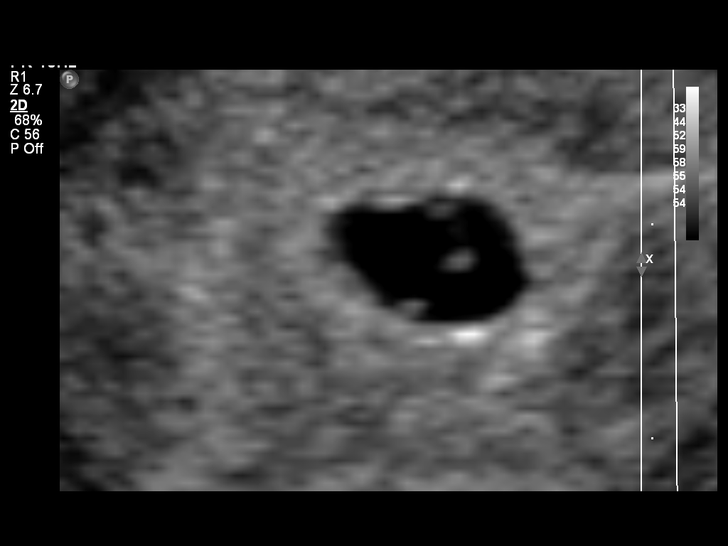
[im 41/48]
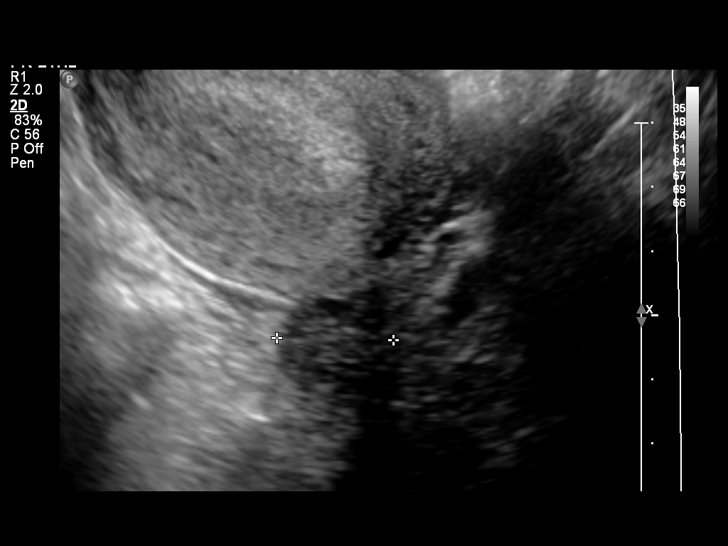
[im 44/48]
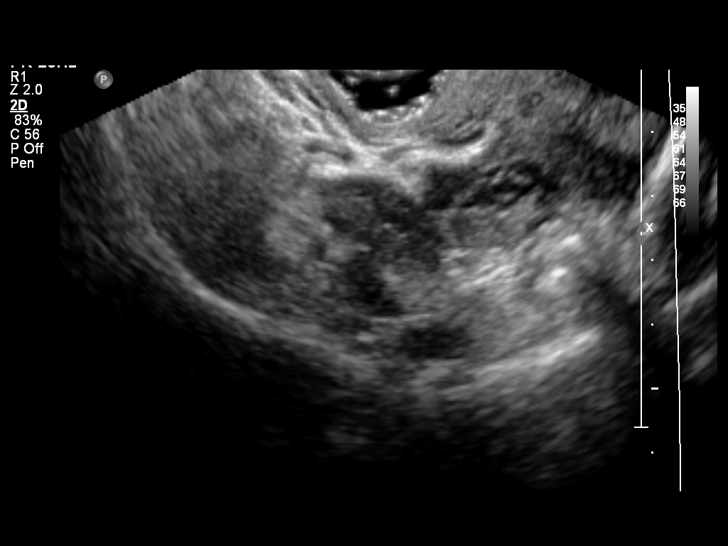
[im 48/48]
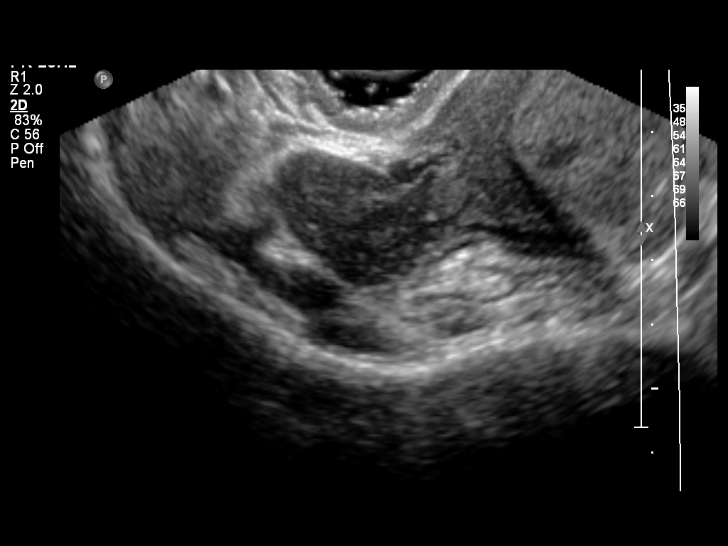

[14 of 28 positions shown; findings below may reference images not displayed]

FINDINGS: Intrauterine gestational sac: Visualized/normal in shape.

Yolk sac:  Yes

Embryo:  No

Cardiac Activity: N/A

MSD: 8.2  mm   5 w   3  d

Maternal uterus/adnexae: No subchorionic hemorrhage is noted. The
uterus is otherwise unremarkable in appearance.

The ovaries are unremarkable in appearance. The right ovary measures
2.8 x 1.4 x 1.7 cm, while the left ovary measures 2.8 x 1.4 x
cm. No suspicious adnexal masses are seen; there is no evidence for
ovarian torsion.

No free fluid is seen in the pelvic cul-de-sac.
IMPRESSION: Single intrauterine gestational sac noted, with an associated yolk
sac. The embryo is not yet seen. The mean sac diameter of 8 mm
corresponds to a gestational age of 5 weeks 3 days; this does not
match the gestational age by LMP, and reflects an estimated date of
delivery April 09, 2014.

## 2014-08-25 LAB — OB RESULTS CONSOLE GC/CHLAMYDIA
Chlamydia: NEGATIVE
Gonorrhea: NEGATIVE

## 2014-08-25 LAB — OB RESULTS CONSOLE GBS: GBS: POSITIVE

## 2014-09-22 ENCOUNTER — Other Ambulatory Visit (HOSPITAL_COMMUNITY): Payer: Self-pay | Admitting: Urology

## 2014-09-22 DIAGNOSIS — O481 Prolonged pregnancy: Secondary | ICD-10-CM

## 2014-09-25 ENCOUNTER — Ambulatory Visit (HOSPITAL_COMMUNITY): Payer: Self-pay

## 2014-09-25 ENCOUNTER — Inpatient Hospital Stay (HOSPITAL_COMMUNITY): Payer: Medicaid Other | Admitting: Anesthesiology

## 2014-09-25 ENCOUNTER — Inpatient Hospital Stay (HOSPITAL_COMMUNITY)
Admission: AD | Admit: 2014-09-25 | Discharge: 2014-09-27 | DRG: 775 | Disposition: A | Discharge status: Home / Self Care | Payer: Medicaid Other | Source: Ambulatory Visit | Attending: Obstetrics & Gynecology | Admitting: Obstetrics & Gynecology

## 2014-09-25 ENCOUNTER — Encounter (HOSPITAL_COMMUNITY): Payer: Self-pay | Admitting: *Deleted

## 2014-09-25 DIAGNOSIS — Z3A40 40 weeks gestation of pregnancy: Secondary | ICD-10-CM | POA: Diagnosis present

## 2014-09-25 DIAGNOSIS — Z833 Family history of diabetes mellitus: Secondary | ICD-10-CM

## 2014-09-25 DIAGNOSIS — Z8249 Family history of ischemic heart disease and other diseases of the circulatory system: Secondary | ICD-10-CM | POA: Diagnosis not present

## 2014-09-25 DIAGNOSIS — O99214 Obesity complicating childbirth: Secondary | ICD-10-CM | POA: Diagnosis present

## 2014-09-25 DIAGNOSIS — E669 Obesity, unspecified: Secondary | ICD-10-CM | POA: Diagnosis not present

## 2014-09-25 DIAGNOSIS — O99824 Streptococcus B carrier state complicating childbirth: Secondary | ICD-10-CM | POA: Diagnosis present

## 2014-09-25 DIAGNOSIS — Z6834 Body mass index (BMI) 34.0-34.9, adult: Secondary | ICD-10-CM | POA: Diagnosis not present

## 2014-09-25 DIAGNOSIS — IMO0001 Reserved for inherently not codable concepts without codable children: Secondary | ICD-10-CM

## 2014-09-25 LAB — CBC
HCT: 38.6 % (ref 36.0–46.0)
HEMOGLOBIN: 13.1 g/dL (ref 12.0–15.0)
MCH: 28.9 pg (ref 26.0–34.0)
MCHC: 33.9 g/dL (ref 30.0–36.0)
MCV: 85 fL (ref 78.0–100.0)
Platelets: 269 10*3/uL (ref 150–400)
RBC: 4.54 MIL/uL (ref 3.87–5.11)
RDW: 14.2 % (ref 11.5–15.5)
WBC: 14.9 10*3/uL — ABNORMAL HIGH (ref 4.0–10.5)

## 2014-09-25 LAB — TYPE AND SCREEN
ABO/RH(D): A POS
Antibody Screen: NEGATIVE

## 2014-09-25 MED ORDER — LACTATED RINGERS IV SOLN
500.0000 mL | Freq: Once | INTRAVENOUS | Status: AC
  Administered 2014-09-25: 1000 mL via INTRAVENOUS

## 2014-09-25 MED ORDER — WITCH HAZEL-GLYCERIN EX PADS: 1.0000 "application " | MEDICATED_PAD | CUTANEOUS | Status: DC | PRN

## 2014-09-25 MED ORDER — DIPHENHYDRAMINE HCL 25 MG PO CAPS: 25.0000 mg | ORAL_CAPSULE | Freq: Four times a day (QID) | ORAL | Status: DC | PRN

## 2014-09-25 MED ORDER — PHENYLEPHRINE 40 MCG/ML (10ML) SYRINGE FOR IV PUSH (FOR BLOOD PRESSURE SUPPORT)
80.0000 ug | PREFILLED_SYRINGE | INTRAVENOUS | Status: DC | PRN
  Filled 2014-09-25 (×2): qty 20

## 2014-09-25 MED ORDER — TETANUS-DIPHTH-ACELL PERTUSSIS 5-2.5-18.5 LF-MCG/0.5 IM SUSP
0.5000 mL | Freq: Once | INTRAMUSCULAR | Status: DC
  Filled 2014-09-25: qty 0.5

## 2014-09-25 MED ORDER — OXYTOCIN 40 UNITS IN LACTATED RINGERS INFUSION - SIMPLE MED
62.5000 mL/h | INTRAVENOUS | Status: DC
  Administered 2014-09-25: 62.5 mL/h via INTRAVENOUS
  Filled 2014-09-25: qty 1000

## 2014-09-25 MED ORDER — PENICILLIN G POTASSIUM 5000000 UNITS IJ SOLR
2.5000 10*6.[IU] | INTRAMUSCULAR | Status: DC
  Administered 2014-09-25: 2.5 10*6.[IU] via INTRAVENOUS
  Filled 2014-09-25 (×4): qty 2.5

## 2014-09-25 MED ORDER — OXYCODONE-ACETAMINOPHEN 5-325 MG PO TABS
1.0000 | ORAL_TABLET | ORAL | Status: DC | PRN
  Administered 2014-09-27: 1 via ORAL
  Filled 2014-09-25: qty 1

## 2014-09-25 MED ORDER — ZOLPIDEM TARTRATE 5 MG PO TABS
5.0000 mg | ORAL_TABLET | Freq: Every evening | ORAL | Status: DC | PRN
Start: 2014-09-25 — End: 2014-09-27

## 2014-09-25 MED ORDER — OXYTOCIN BOLUS FROM INFUSION
500.0000 mL | INTRAVENOUS | Status: DC
Start: 2014-09-25 — End: 2014-09-25
  Administered 2014-09-25: 500 mL via INTRAVENOUS

## 2014-09-25 MED ORDER — OXYCODONE-ACETAMINOPHEN 5-325 MG PO TABS: 2.0000 | ORAL_TABLET | ORAL | Status: DC | PRN

## 2014-09-25 MED ORDER — ONDANSETRON HCL 4 MG PO TABS
4.0000 mg | ORAL_TABLET | ORAL | Status: DC | PRN
Start: 2014-09-25 — End: 2014-09-27

## 2014-09-25 MED ORDER — FENTANYL CITRATE 0.05 MG/ML IJ SOLN: 100.0000 ug | INTRAMUSCULAR | Status: DC | PRN

## 2014-09-25 MED ORDER — PRENATAL MULTIVITAMIN CH
1.0000 | ORAL_TABLET | Freq: Every day | ORAL | Status: DC
  Administered 2014-09-26: 1 via ORAL
  Filled 2014-09-25: qty 1

## 2014-09-25 MED ORDER — SIMETHICONE 80 MG PO CHEW: 80.0000 mg | CHEWABLE_TABLET | ORAL | Status: DC | PRN

## 2014-09-25 MED ORDER — LACTATED RINGERS IV SOLN: 500.0000 mL | INTRAVENOUS | Status: DC | PRN

## 2014-09-25 MED ORDER — OXYCODONE-ACETAMINOPHEN 5-325 MG PO TABS: 1.0000 | ORAL_TABLET | ORAL | Status: DC | PRN

## 2014-09-25 MED ORDER — LACTATED RINGERS IV SOLN
INTRAVENOUS | Status: DC
  Administered 2014-09-25 (×2): via INTRAVENOUS

## 2014-09-25 MED ORDER — LIDOCAINE HCL (PF) 1 % IJ SOLN
INTRAMUSCULAR | Status: DC | PRN
  Administered 2014-09-25 (×2): 4 mL

## 2014-09-25 MED ORDER — LIDOCAINE HCL (PF) 1 % IJ SOLN
30.0000 mL | INTRAMUSCULAR | Status: DC | PRN
  Filled 2014-09-25: qty 30

## 2014-09-25 MED ORDER — FENTANYL 2.5 MCG/ML BUPIVACAINE 1/10 % EPIDURAL INFUSION (WH - ANES)
INTRAMUSCULAR | Status: DC | PRN
  Administered 2014-09-25: 14 mL/h via EPIDURAL

## 2014-09-25 MED ORDER — IBUPROFEN 600 MG PO TABS
600.0000 mg | ORAL_TABLET | Freq: Four times a day (QID) | ORAL | Status: DC
  Administered 2014-09-25 – 2014-09-27 (×7): 600 mg via ORAL
  Filled 2014-09-25 (×6): qty 1

## 2014-09-25 MED ORDER — FENTANYL 2.5 MCG/ML BUPIVACAINE 1/10 % EPIDURAL INFUSION (WH - ANES)
14.0000 mL/h | INTRAMUSCULAR | Status: DC | PRN
  Filled 2014-09-25: qty 125

## 2014-09-25 MED ORDER — CITRIC ACID-SODIUM CITRATE 334-500 MG/5ML PO SOLN: 30.0000 mL | ORAL | Status: DC | PRN

## 2014-09-25 MED ORDER — ONDANSETRON HCL 4 MG/2ML IJ SOLN: 4.0000 mg | INTRAMUSCULAR | Status: DC | PRN

## 2014-09-25 MED ORDER — EPHEDRINE 5 MG/ML INJ: 10.0000 mg | INTRAVENOUS | Status: DC | PRN

## 2014-09-25 MED ORDER — BENZOCAINE-MENTHOL 20-0.5 % EX AERO
1.0000 "application " | INHALATION_SPRAY | CUTANEOUS | Status: DC | PRN
  Filled 2014-09-25 (×2): qty 56

## 2014-09-25 MED ORDER — ONDANSETRON HCL 4 MG/2ML IJ SOLN: 4.0000 mg | Freq: Four times a day (QID) | INTRAMUSCULAR | Status: DC | PRN

## 2014-09-25 MED ORDER — DIBUCAINE 1 % RE OINT
1.0000 "application " | TOPICAL_OINTMENT | RECTAL | Status: DC | PRN
  Filled 2014-09-25: qty 28

## 2014-09-25 MED ORDER — DIPHENHYDRAMINE HCL 50 MG/ML IJ SOLN: 12.5000 mg | INTRAMUSCULAR | Status: DC | PRN

## 2014-09-25 MED ORDER — SENNOSIDES-DOCUSATE SODIUM 8.6-50 MG PO TABS
2.0000 | ORAL_TABLET | ORAL | Status: DC
  Administered 2014-09-26 (×2): 2 via ORAL
  Filled 2014-09-25 (×2): qty 2

## 2014-09-25 MED ORDER — PENICILLIN G POTASSIUM 5000000 UNITS IJ SOLR
5.0000 10*6.[IU] | Freq: Once | INTRAVENOUS | Status: AC
  Administered 2014-09-25: 5 10*6.[IU] via INTRAVENOUS
  Filled 2014-09-25: qty 5

## 2014-09-25 MED ORDER — LANOLIN HYDROUS EX OINT: TOPICAL_OINTMENT | CUTANEOUS | Status: DC | PRN

## 2014-09-25 MED ORDER — FLEET ENEMA 7-19 GM/118ML RE ENEM: 1.0000 | ENEMA | RECTAL | Status: DC | PRN

## 2014-09-25 MED ORDER — ACETAMINOPHEN 325 MG PO TABS: 650.0000 mg | ORAL_TABLET | ORAL | Status: DC | PRN

## 2014-09-25 MED ORDER — PHENYLEPHRINE 40 MCG/ML (10ML) SYRINGE FOR IV PUSH (FOR BLOOD PRESSURE SUPPORT)
80.0000 ug | PREFILLED_SYRINGE | INTRAVENOUS | Status: DC | PRN
  Administered 2014-09-25 (×2): 80 ug via INTRAVENOUS

## 2014-09-25 MED ORDER — ACETAMINOPHEN 325 MG PO TABS
650.0000 mg | ORAL_TABLET | ORAL | Status: DC | PRN
  Administered 2014-09-26 (×2): 650 mg via ORAL
  Filled 2014-09-25 (×2): qty 2

## 2014-09-25 NOTE — Progress Notes (Signed)
Patient ID: Tina Carson, female   DOB: 12/26/1980, 34 y.o.   MRN: [id number] Tina Carson is a 34 y.o. G4P1021 at 5871w3d.  Subjective: Mild pressure w/ UC's.   Objective: BP 102/70 mmHg  Pulse 107  Temp(Src) 97.7 F (36.5 C) (Oral)  Resp 20  Ht 4\' 9"  (1.448 m)  Wt 161 lb (73.029 kg)  BMI 34.83 kg/m2  SpO2 99%  LMP 12/08/2013  Patient Vitals for the past 24 hrs:  BP Temp Temp src Pulse Resp SpO2 Height Weight  09/25/14 1340 102/70 mmHg - - (!) 107 20 - - -  09/25/14 1331 (!) 98/53 mmHg - - 98 20 - - -  09/25/14 1319 108/74 mmHg - - 96 20 - - -  09/25/14 1313 - - - (!) 101 - - - -  09/25/14 1300 (!) 88/57 mmHg - - 98 18 - - -  09/25/14 1230 107/80 mmHg - - (!) 168 20 - - -  09/25/14 1205 103/70 mmHg - - 95 18 - - -  09/25/14 1201 102/66 mmHg 97.7 F (36.5 C) Oral (!) 102 20 - - -  09/25/14 1155 108/70 mmHg - - 97 18 - - -  09/25/14 1154 - - - 98 - 99 % - -  09/25/14 1150 102/61 mmHg - - 93 18 - - -  09/25/14 1149 - - - 92 - 100 % - -  09/25/14 1146 103/63 mmHg - - 95 18 - - -  09/25/14 1144 - - - 92 - 100 % - -  09/25/14 1140 108/63 mmHg - - 97 20 - - -  09/25/14 1139 - - - 100 - 100 % - -  09/25/14 1135 (!) 89/57 mmHg - - 98 18 - - -  09/25/14 1134 - - - 93 - (!) 84 % - -  09/25/14 1131 (!) 79/40 mmHg - - 95 18 - - -  09/25/14 1129 - - - (!) 108 - 95 % - -  09/25/14 1125 102/66 mmHg - - (!) 102 18 - - -  09/25/14 1124 - - - (!) 111 - 91 % - -  09/25/14 1121 - - - (!) 117 - - - -  09/25/14 1120 (!) 89/50 mmHg - - (!) 54 18 (!) 88 % - -  09/25/14 1116 119/61 mmHg - - (!) 110 20 - - -  09/25/14 1115 107/81 mmHg - - (!) 153 20 - - -  09/25/14 1112 111/60 mmHg - - (!) 110 20 - - -  09/25/14 1026 117/85 mmHg 98.8 F (37.1 C) Oral (!) 124 20 - - -  09/25/14 0953 120/76 mmHg - - 119 - - - -  09/25/14 0729 111/80 mmHg 98.7 F (37.1 C) Oral 112 18 - 4\' 9"  (1.448 m) 161 lb (73.029 kg)   FHT:  FHR: 140 bpm, variability: mod,  accelerations:   15x15,  decelerations:  One prolonged (2 min) and recurrent mild variables/lates over past hour, possible R/T post-epidural hypotension. Resolved w/ Fluid bolus, phenylephrine, position changes.  UC:   Q 3minutes, strong  Dilation: 9 Effacement (%): 100 Cervical Position: Anterior Station: 0 Presentation: Vertex Exam by:: Enis SlipperJane Bailey, RN  SROM mod MSF  Labs: Results for orders placed or performed during the hospital encounter of 09/25/14 (from the past 24 hour(s))  CBC     Status: Abnormal   Collection Time: 09/25/14 10:30 AM  Result Value Ref Range   WBC 14.9 (H) 4.0 - 10.5  K/uL   RBC 4.54 3.87 - 5.11 MIL/uL   Hemoglobin 13.1 12.0 - 15.0 g/dL   HCT 81.1 91.4 - 78.2 %   MCV 85.0 78.0 - 100.0 fL   MCH 28.9 26.0 - 34.0 pg   MCHC 33.9 30.0 - 36.0 g/dL   RDW 95.6 21.3 - 08.6 %   Platelets 269 150 - 400 K/uL  Type and screen     Status: None   Collection Time: 09/25/14 10:38 AM  Result Value Ref Range   ABO/RH(D) A POS    Antibody Screen NEG    Sample Expiration 09/28/2014     Assessment / Plan: [redacted]w[redacted]d week IUP Labor: transition Fetal Wellbeing:  Category I-II Pain Control:  Epidural Anticipated MOD:  SVD Plan NICU team at delivery  [place], CNM 09/25/2014 1:52 PM

## 2014-09-25 NOTE — Anesthesia Procedure Notes (Addendum)
Epidural Patient location during procedure: OB Start time: 09/25/2014 11:00 AM  Staffing Anesthesiologist: Karie SchwalbeJUDD, Chandell Attridge Performed by: anesthesiologist   Preanesthetic Checklist Completed: patient identified, site marked, surgical consent, pre-op evaluation, timeout performed, IV checked, risks and benefits discussed and monitors and equipment checked  Epidural Patient position: sitting Prep: site prepped and draped and DuraPrep Patient monitoring: continuous pulse ox and blood pressure Approach: midline Location: L3-L4 Injection technique: LOR saline  Needle:  Needle type: Tuohy  Needle gauge: 17 G Needle length: 9 cm and 9 Needle insertion depth: 6 cm Catheter type: closed end flexible Catheter size: 19 Gauge Catheter at skin depth: 10 cm Test dose: negative  Assessment Events: blood not aspirated, injection not painful, no injection resistance, negative IV test and no paresthesia  Additional Notes Patient identified. Risks/Benefits/Options discussed with patient including but not limited to bleeding, infection, nerve damage, paralysis, failed block, incomplete pain control, headache, blood pressure changes, nausea, vomiting, reactions to medication both or allergic, itching and postpartum back pain. Confirmed with bedside nurse the patient's most recent platelet count. Confirmed with patient that they are not currently taking any anticoagulation, have any bleeding history or any family history of bleeding disorders. Patient expressed understanding and wished to proceed. All questions were answered. Sterile technique was used throughout the entire procedure. Please see nursing notes for vital signs. Test dose was given through epidural catheter and negative prior to continuing to dose epidural or start infusion. Warning signs of high block given to the patient including shortness of breath, tingling/numbness in hands, complete motor block, or any concerning symptoms with instructions to  call for help. Patient was given instructions on fall risk and not to get out of bed. All questions and concerns addressed with instructions to call with any issues or inadequate analgesia.

## 2014-09-25 NOTE — Anesthesia Preprocedure Evaluation (Signed)
Anesthesia Evaluation  Patient identified by MRN, date of birth, ID band Patient awake    Reviewed: Allergy & Precautions, NPO status , Patient's Chart, lab work & pertinent test results  History of Anesthesia Complications Negative for: history of anesthetic complications  Airway Mallampati: II  TM Distance: >3 FB Neck ROM: Full    Dental no notable dental hx. (+) Dental Advisory Given   Pulmonary neg pulmonary ROS,  breath sounds clear to auscultation  Pulmonary exam normal       Cardiovascular negative cardio ROS  Rhythm:Regular Rate:Normal     Neuro/Psych negative neurological ROS  negative psych ROS   GI/Hepatic negative GI ROS, Neg liver ROS,   Endo/Other  obesity  Renal/GU negative Renal ROS  negative genitourinary   Musculoskeletal negative musculoskeletal ROS (+)   Abdominal   Peds negative pediatric ROS (+)  Hematology negative hematology ROS (+)   Anesthesia Other Findings   Reproductive/Obstetrics (+) Pregnancy                             Anesthesia Physical Anesthesia Plan  ASA: II  Anesthesia Plan: Epidural   Post-op Pain Management:    Induction:   Airway Management Planned:   Additional Equipment:   Intra-op Plan:   Post-operative Plan:   Informed Consent: I have reviewed the patients History and Physical, chart, labs and discussed the procedure including the risks, benefits and alternatives for the proposed anesthesia with the patient or authorized representative who has indicated his/her understanding and acceptance.   Dental advisory given  Plan Discussed with: CRNA  Anesthesia Plan Comments:         Anesthesia Quick Evaluation

## 2014-09-25 NOTE — H&P (Signed)
LABOR ADMISSION HISTORY AND PHYSICAL  Tina Carson is a 34 y.o. female [phone] [id number] with IUP at 10954w3d by 18w u/s presenting for onset of labor. Her cervical exam changed from 1 to 3 cm in MAU. She reports +FMs, No LOF, no VB, no blurry vision, headaches or peripheral edema, and RUQ pain.  She plans on breast feeding. She is unsure for birth control.  Plans on bringing child to unsure for pediatric care after discharge.   Dating: By 18w ultrasound --->  Estimated Date of Delivery: 09/22/14  Sono:    @[redacted]w[redacted]d , CWD, normal anatomy, breech presentation, 422g, 27% EFW   Prenatal History/Complications:  Past Medical History: Past Medical History  Diagnosis Date  . Medical history non-contributory     Past Surgical History: Past Surgical History  Procedure Laterality Date  . No past surgeries      Obstetrical History: OB History    Gravida Para Term Preterm AB TAB SAB Ectopic Multiple Living   4 1 1  2  2   1       Social History: History   Social History  . Marital Status: Single    Spouse Name: N/A  . Number of Children: N/A  . Years of Education: N/A   Social History Main Topics  . Smoking status: Never Smoker   . Smokeless tobacco: Never Used  . Alcohol Use: No  . Drug Use: No  . Sexual Activity: Yes   Other Topics Concern  . None   Social History Narrative    Family History: Family History  Problem Relation Age of Onset  . Hypertension Maternal Grandmother   . Diabetes Maternal Grandmother     Allergies: No Known Allergies  Prescriptions prior to admission  Medication Sig Dispense Refill Last Dose  . Prenatal Multivit-Min-Fe-FA (PRENATAL VITAMINS) 0.8 MG tablet Take 1 tablet by mouth daily. 30 tablet 12 09/24/2014 at Unknown time  . HYDROcodone-acetaminophen (NORCO/VICODIN) 5-325 MG per tablet Take 1 tablet by mouth every 6 (six) hours as needed for moderate pain. (Patient not taking: Reported on 09/25/2014) 20 tablet 0 Not Taking  . promethazine  (PHENERGAN) 12.5 MG tablet Take 1 tablet (12.5 mg total) by mouth every 6 (six) hours as needed for nausea or vomiting. (Patient not taking: Reported on 09/25/2014) 30 tablet 0 Not Taking     Review of Systems   All systems reviewed and negative except as stated in HPI  Blood pressure 120/76, pulse 119, temperature 98.7 F (37.1 C), temperature source Oral, resp. rate 18, height 4\' 9"  (1.448 Carson), weight 161 lb (73.029 kg), last menstrual period 12/08/2013, unknown if currently breastfeeding. General appearance: alert, cooperative, appears stated age, flushed and uncomfortable (contractions) Lungs: clear to auscultation bilaterally Heart: regular rate and rhythm Abdomen: soft, non-tender; bowel sounds normal Pelvic: as below Extremities: Homans sign is negative, no sign of DVT DTR's patellar 2/4 Presentation: unsure Fetal monitoringBaseline: 145  bpm, Variability: Good {> 6 bpm) and Accelerations: Reactive Uterine activityDate/time of onset: 6pm yesterday, irregular q2-3 minutes  Dilation: 3 Effacement (%): 90 Station: -3 Exam by:: jolynn   Prenatal labs: ABO, Rh:  A+ Antibody:  Negative Rubella:  Immune RPR:   NR HBsAg:   Neg HIV:   NR GBS:   GBS+ 1 hr Glucola normal 103 Genetic screening  Quad neg Anatomy US breech on 05/20/14 ultrasound, otherwise normal  Prenatal Transfer Tool  Maternal Diabetes: No Genetic Screening: Normal Maternal Ultrasounds/Referrals: Normal Fetal Ultrasounds or other Referrals:  None Maternal Substance Abuse:  No Significant Maternal Medications:  None Significant Maternal Lab Results: Lab values include: Group B Strep positive  No results found for this or any previous visit (from the past 24 hour(s)).  Patient Active Problem List   Diagnosis Date Noted  . [redacted] weeks gestation of pregnancy   . Evaluate anatomy not seen on prior sonogram   . Screening, antenatal, for fetal anatomic survey   . [redacted] weeks gestation of pregnancy      Assessment: Tina Carson is a 34 y.o. [id number] at [redacted]w[redacted]d here for onset of labor.  #Labor:ancipate NSVD #Pain: Fentanyl, unsure if wants epidural #FWB: Cat 1 #ID:  GBS+, order PCN #MOF: Breast #MOC: unsure #Circ:  Unsure, Medicaid  Tina Flavin M, DO 09/25/2014, 10:02 AM  I was present for the exam and agree with above.  Tina Carson, CNM 09/25/2014 1:50 PM

## 2014-09-25 NOTE — MAU Note (Signed)
Baby has not been moving as much last 3 days.   Induction planned for 04/14

## 2014-09-25 NOTE — MAU Note (Signed)
Contracting all night, getting stronger and closer. Small amt of bloody mucous this morning. 2nd preg, no problems.

## 2014-09-26 LAB — RPR: RPR Ser Ql: NONREACTIVE

## 2014-09-26 NOTE — Lactation Note (Signed)
This note was copied from the chart of Tina Carson. Lactation Consultation Note Experienced BF for 11 months to her 10 yr. Old daughter. Explained a lot has changed in 10 yrs. Mom states baby has been latching well. Hand expression demonstrated to show colostrum. Noted thick clear colostrum.  Rt. Nipple more everted than Lt.  Mom denies painful latches. Mom encouraged to feed baby 8-12 times/24 hours and with feeding cues. Mom encouraged to do skin-to-skin. Educated about newborn behavior. Mom encouraged to waken baby for feeds. Referred to Baby and Me Book in Breastfeeding section Pg. 22-23 for position options and Proper latch demonstration.[facility] brochure given w/resources, support groups and LC services. Mom speaks good [place], communicated well. Discussed supply and demand, proper positioning, sore nipples, and I&O. Patient Name: Tina Tasheba Carson Today's Date: 09/26/2014 Reason for consult: Initial assessment   Maternal Data Has patient been taught Hand Expression?: Yes Does the patient have breastfeeding experience prior to this delivery?: Yes  Feeding Feeding Type: Breast Fed Length of feed: 20 min  LATCH Score/Interventions Intervention(s): Skin to skin;Teach feeding cues;Waking techniques  Audible Swallowing: None Intervention(s): Skin to skin;Hand expression  Type of Nipple: Everted at rest and after stimulation  Comfort (Breast/Nipple): Soft / non-tender           Lactation Tools Discussed/Used WIC Program: Yes   Consult Status Consult Status: Follow-up Date: 09/26/14 Follow-up type: In-patient    Charyl DancerCARVER, Danniel Tones G 09/26/2014, 6:37 AM

## 2014-09-26 NOTE — Progress Notes (Signed)
Post Partum Day #1 Subjective:  Tina Carson is a 34 y.o. [id number] 6493w3d s/p NSVD.  No acute events overnight.  Pt denies problems with ambulating, voiding or po intake.  She denies nausea or vomiting.  Pain is well controlled.  She has not had flatus. She has not had bowel movement.  Lochia Moderate.  Plan for birth control is unsure but thinks probably nexplanon.  Method of Feeding: Breast  She reports that she is feeling well today.  Denies any concerns at this time.  Objective: Blood pressure 104/58, pulse 90, temperature 97.9 F (36.6 C), temperature source Oral, resp. rate 18, height 4\' 9"  (1.448 m), weight 161 lb (73.029 kg), last menstrual period 12/08/2013, SpO2 99 %, unknown if currently breastfeeding.  Physical Exam:  General: alert, cooperative and no distress Lochia:normal flow Chest: CTAB, no increased WOB Heart: RRR no m/r/g Abdomen: +BS, soft, nontender Uterine Fundus: firm, several fingerbreadths below umbilicus DVT Evaluation: No evidence of DVT seen on physical exam. Extremities: WWP, no edema, +2 DP   Recent Labs  09/25/14 1030  HGB 13.1  HCT 38.6    Assessment/Plan:  ASSESSMENT: Tina Carson is a 34 y.o. [id number] 3693w3d s/p NSVD  Plan for discharge tomorrow, Breastfeeding and Contraception likely Nexplanon   LOS: 1 day   Delynn FlavinGottschalk, Ashly M, DO 09/26/2014, 8:00 AM   CNM attestation Post Partum Day #1 I have seen and examined this patient and agree with above documentation in the resident's note.   Tina Carson is a 34 y.o. [id number] s/p SVD.  Pt denies problems with ambulating, voiding or po intake. Pain is well controlled.  Plan for birth control is considering Nexplanon.  Method of Feeding: breast  PE:  BP 104/58 mmHg  Pulse 90  Temp(Src) 97.9 F (36.6 C) (Oral)  Resp 18  Ht 4\' 9"  (1.448 m)  Wt 73.029 kg (161 lb)  BMI 34.83 kg/m2  SpO2 99%  LMP 12/08/2013  Breastfeeding? Unknown Fundus  firm  Plan for discharge: 09/27/14  Cam HaiSHAW, KIMBERLY, CNM 9:40 AM

## 2014-09-26 NOTE — Anesthesia Postprocedure Evaluation (Signed)
Anesthesia Post Note  Patient: Tina Carson  Procedure(s) Performed: * No procedures listed *  Anesthesia type: Epidural  Patient location: Mother/Baby  Post pain: Pain level controlled  Post assessment: Post-op Vital signs reviewed  Last Vitals:  Filed Vitals:   09/26/14 0559  BP: 104/58  Pulse: 90  Temp: 36.6 C  Resp: 18    Post vital signs: Reviewed  Level of consciousness:alert  Complications: No apparent anesthesia complications

## 2014-09-26 NOTE — Lactation Note (Signed)
This note was copied from the chart of Tina Carson. Lactation Consultation Note  Baby latched  In cradle position upon entering roomn on the tip of mother's nipple.  Nipple flattened and pink after. Repositioned baby in football hold deeper on the breast. Sucks and some swallows observed. Suggest she massage breast instead of pulling breast tissue away from baby's nose. Mother states she has "no milk".  Reviewed hand expression with mother and showed her drops of colostrum. Reviewed Baby & Me booklet (Spanish) deep latch picture. Encouraged her to call if she needs further assistance.  Patient Name: Tina Osceola Carson NFAOZ'HToday's Date: 09/26/2014 Reason for consult: Follow-up assessment   Maternal Data    Feeding Feeding Type: Breast Fed Length of feed: 25 min  LATCH Score/Interventions Latch: Grasps breast easily, tongue down, lips flanged, rhythmical sucking. Intervention(s): Waking techniques  Audible Swallowing: A few with stimulation     Comfort (Breast/Nipple): Soft / non-tender     Hold (Positioning): Assistance needed to correctly position infant at breast and maintain latch.     Lactation Tools Discussed/Used     Consult Status Consult Status: Follow-up Date: 09/27/14 Follow-up type: In-patient    Dahlia ByesBerkelhammer, Tyson Masin [facility] 09/26/2014, 2:24 PM

## 2014-09-27 MED ORDER — IBUPROFEN 600 MG PO TABS: 600.0000 mg | ORAL_TABLET | Freq: Four times a day (QID) | ORAL | Status: AC | PRN

## 2014-09-27 NOTE — Progress Notes (Signed)
At 0615 pt BP 87/57 with pulse 79.  Pt was in a side lying position at the time while breastfeeding her baby.  Pt reported some dizziness and blurred vision when standing.  RN retook pt vitals with pt lying on her back: BP 92/61, HR 79; Sitting 103/79, HR 81; Standing BP 98/62, HR 83.  Pt denied dizziness when moving from lying to sitting but did report dizziness and slightly blurred vision from sitting to standing that slowly resolved with time.  Pt reported poor PO intake over night.  Given juice and water and encouraged to drink.  CNM Philipp DeputyKim Shaw notified.  No new orders at this time.  Will continue to monitor.

## 2014-09-27 NOTE — Discharge Instructions (Signed)

## 2014-09-27 NOTE — Discharge Summary (Signed)
Obstetric Discharge Summary Reason for Admission: onset of labor Prenatal Procedures: none Intrapartum Procedures: spontaneous vaginal delivery Postpartum Procedures: none Complications-Operative and Postpartum: small superficial tear  At 6:04 PM a viable female was delivered via Vaginal, Spontaneous Delivery (Presentation: occiput anterior). APGAR: 7, 9; weight pending.  Placenta status: intact. Cord: 3 vessels.   Anesthesia: epidural  Episiotomy: n/a Lacerations: Small superficial tear Suture Repair: none Est. Blood Loss (mL): 250cc    NICU provider at bedside in setting of meconium stained fluid. Baby immediately placed under warmer and bulb suctioned. Please see NICU note. Mom to postpartum. Baby to Couplet care / Skin to Skin. Placenta to: BS Feeding: Breast Circ: None [facility]  [facility]:  Active Problems:   NSVD (normal spontaneous vaginal delivery)   Tina Carson is a 34 y.o. [id number] s/p SVD PPD #2.  Patient was admitted for spontaneous onset of labor.  She has postpartum course that was uncomplicated including no problems with ambulating, PO intake, urination, pain, or bleeding. The pt feels ready to go home and  will be discharged with outpatient follow-up.   Today: No acute events overnight.  Pt denies problems with ambulating, voiding or po intake.  She denies nausea or vomiting.  Pain is well controlled.  She has had flatus. She has not had bowel movement.  Lochia Minimal.  Plan for birth control is  Nexplanon.  Method of Feeding: Breast/Bottle  Physical Exam:  General: alert, cooperative and no distress Lochia: appropriate Uterine Fundus: firm DVT Evaluation: No evidence of DVT seen on physical exam.  H/H: Lab Results  Component Value Date/Time   HGB 13.1 09/25/2014 10:30 AM   HCT 38.6 09/25/2014 10:30 AM    Discharge Diagnoses: Term Pregnancy-delivered  Discharge Information: Date: 09/27/2014 Activity:  pelvic rest Diet: routine  Medications: Ibuprofen Breast feeding:  Yes Condition: stable Instructions: refer to handout Discharge to: home      Medication List    STOP taking these medications        HYDROcodone-acetaminophen 5-325 MG per tablet  Commonly known as:  NORCO/VICODIN     promethazine 12.5 MG tablet  Commonly known as:  PHENERGAN      TAKE these medications        ibuprofen 600 MG tablet  Commonly known as:  ADVIL,MOTRIN  Take 1 tablet (600 mg total) by mouth every 6 (six) hours as needed.     Prenatal Vitamins 0.8 MG tablet  Take 1 tablet by mouth daily.           Follow-up Information    [facility] [place] Schedule an appointment as soon as possible for a visit in 4 weeks.   Why:  For your postpartum appointment.   Contact information:   [address] [place] [phone]      Tina Carson  09/27/2014,7:51 AM   CNM attestation I have seen and examined this patient and agree with above documentation in the medical student's note.   Tina Carson is a 34 y.o. [id number] s/p SVD.   Pain is well controlled.  Plan for birth control is Nexplanon.  Method of Feeding: br/bo  PE:  BP 98/62 mmHg  Pulse 83  Temp(Src) 97.8 F (36.6 C) (Oral)  Resp 18  Ht 4\' 9"  (1.448 m)  Wt 73.029 kg (161 lb)  BMI 34.83 kg/m2  SpO2 99%  LMP 12/08/2013  Breastfeeding? Unknown Heart: RRR Lungs: nl effort Fundus firm    Recent  Labs  09/25/14 1030  HGB 13.1  HCT 38.6     Plan: discharge today - postpartum care discussed - f/u clinic in 6 weeks for postpartum visit   SHAW, KIMBERLY, CNM 8:43 AM

## 2014-09-27 NOTE — Lactation Note (Signed)
This note was copied from the chart of Tina Carson. Lactation Consultation Note: Mom ready for DC. Reports baby is feeding well but nipples are hurting. Both nipples with positional stripes noted on them. Reports baby just finished feeding. Encouraged wide open mouth and keeping the baby close to the breast throughout the feeding. Experienced BF mom. Breasts are feeling fuller this morning. Encouraged frequent feeding whenever showing feeding cues. No questions t present. To call prn  Patient Name: Tina Carson ZHYQM'VToday's Date: 09/27/2014 Reason for consult: Follow-up assessment   Maternal Data Formula Feeding for Exclusion: No Does the patient have breastfeeding experience prior to this delivery?: Yes  Feeding    LATCH Score/Interventions       Type of Nipple: Everted at rest and after stimulation  Comfort (Breast/Nipple): Filling, red/small blisters or bruises, mild/mod discomfort  Problem noted: Filling;Mild/Moderate discomfort Interventions (Mild/moderate discomfort): Comfort gels;Hand expression;Hand massage        Lactation Tools Discussed/Used     Consult Status Consult Status: Complete    Pamelia HoitWeeks, Atarah Cadogan D 09/27/2014, 10:39 AM

## 2014-09-30 NOTE — Progress Notes (Signed)
Post discharge chart review completed.  

## 2014-10-02 ENCOUNTER — Inpatient Hospital Stay (HOSPITAL_COMMUNITY): Admission: RE | Admit: 2014-10-02 | Payer: Self-pay | Source: Ambulatory Visit

## 2014-10-02 ENCOUNTER — Ambulatory Visit (HOSPITAL_COMMUNITY): Payer: Self-pay

## 2014-10-02 ENCOUNTER — Ambulatory Visit (HOSPITAL_COMMUNITY): Admission: RE | Admit: 2014-10-02 | Payer: Self-pay | Source: Ambulatory Visit

## 2015-05-02 IMAGING — US US OB COMP +14 WK
1 series · 12 of 28 positions shown · non-contrast
Comparison: none

[Series 1: us ob comp +14 wk mfm · 12 of 67 slices shown]
[im 3/67]
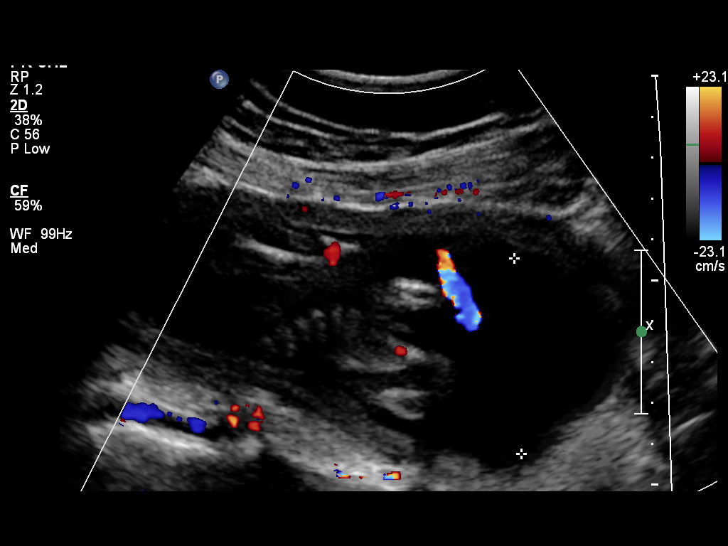
[im 8/67]
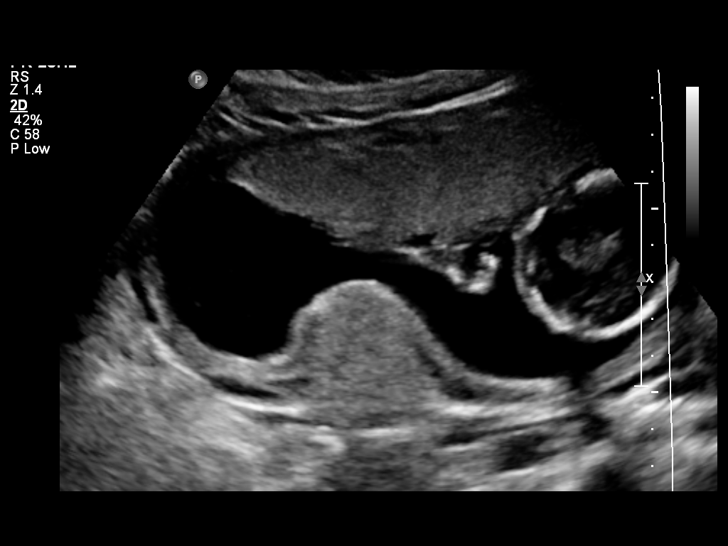
[im 13/67]
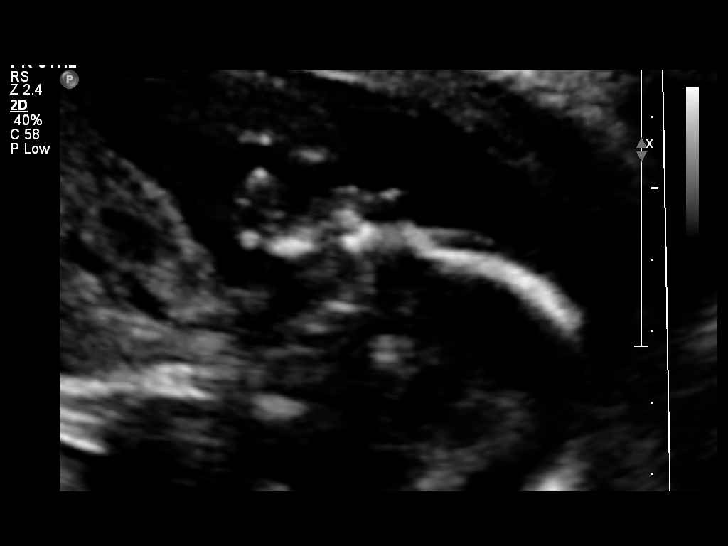
[im 20/67]
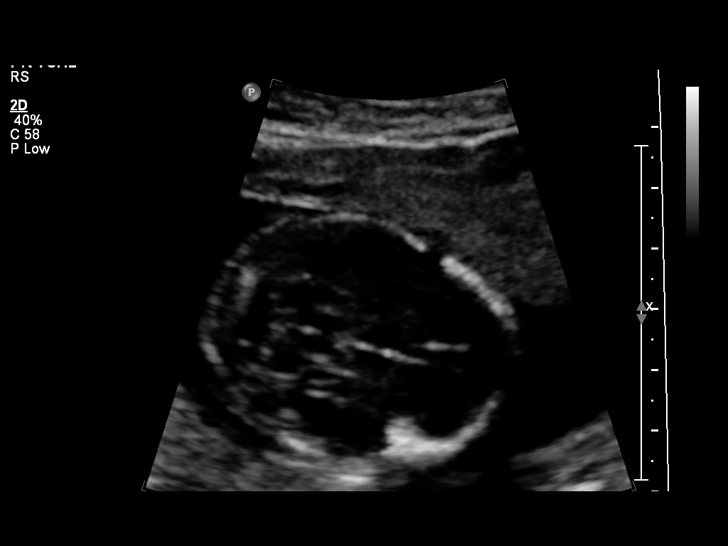
[im 25/67]
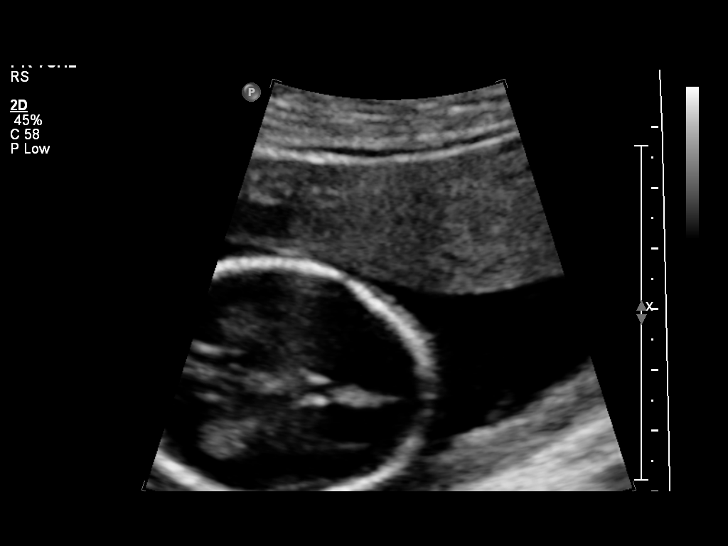
[im 30/67]
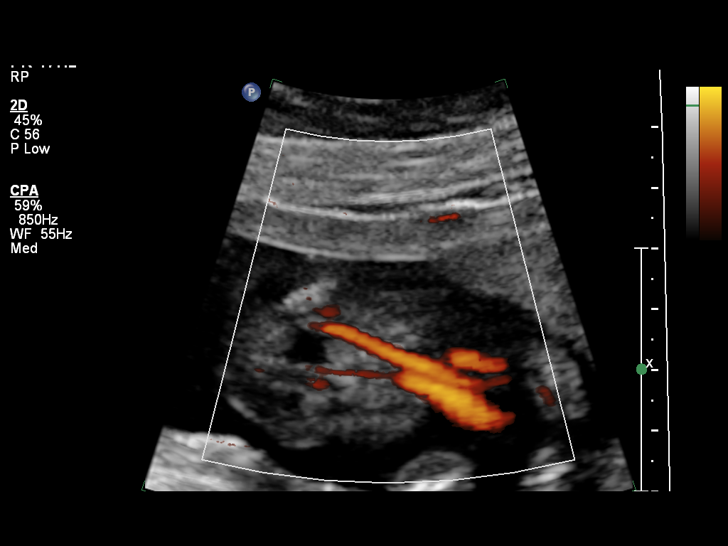
[im 37/67]
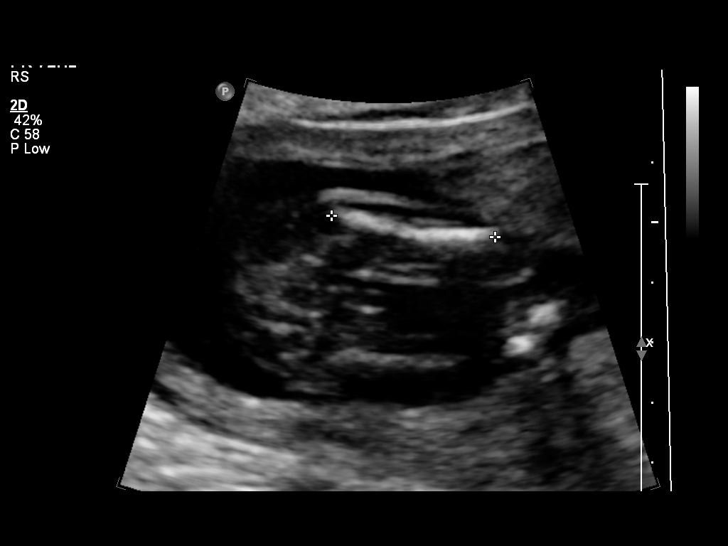
[im 42/67]
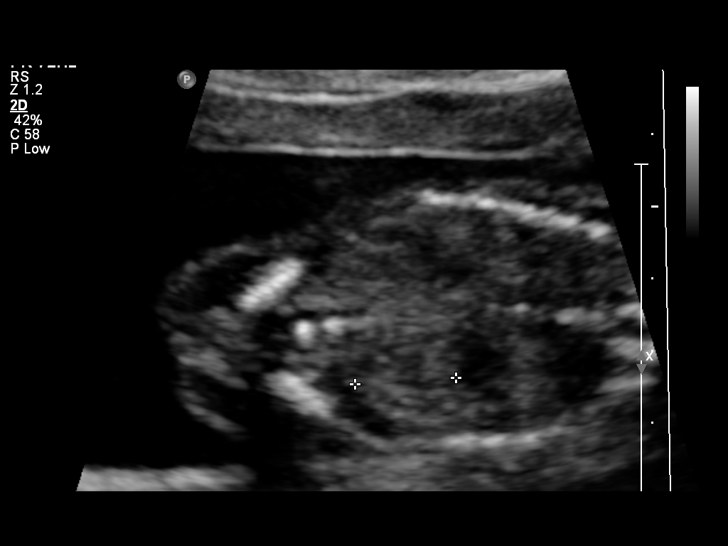
[im 47/67]
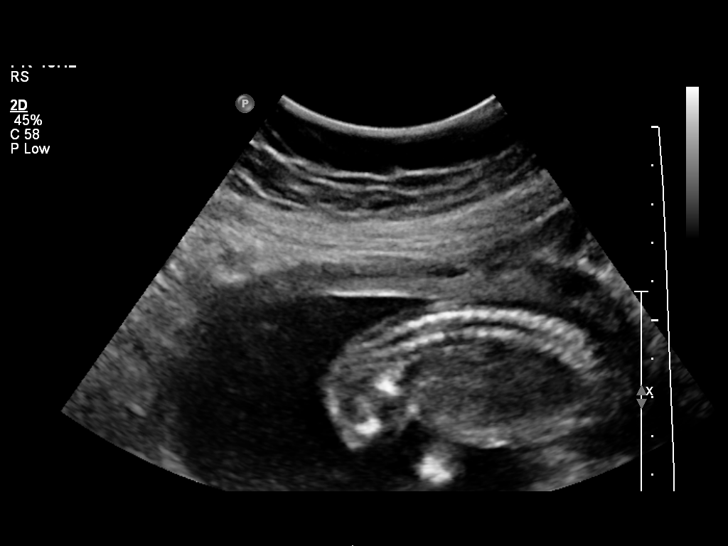
[im 54/67]
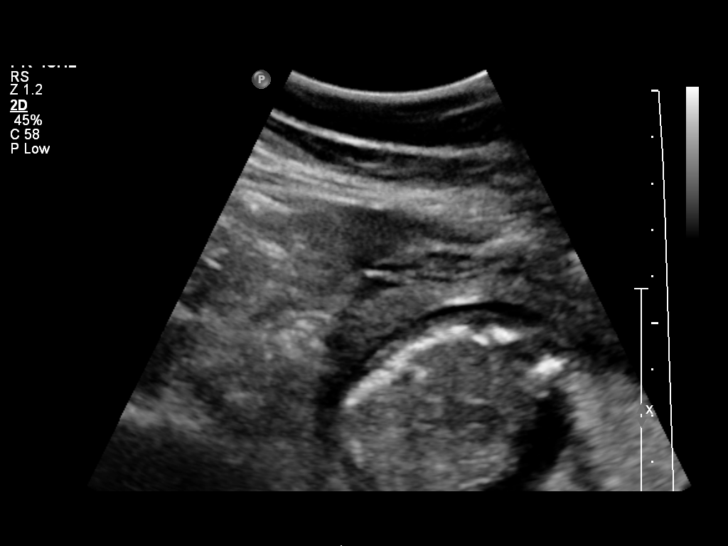
[im 59/67]
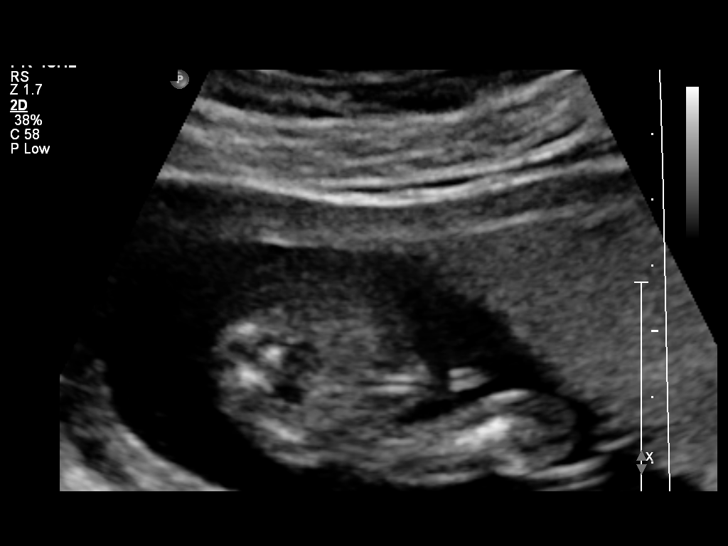
[im 64/67]
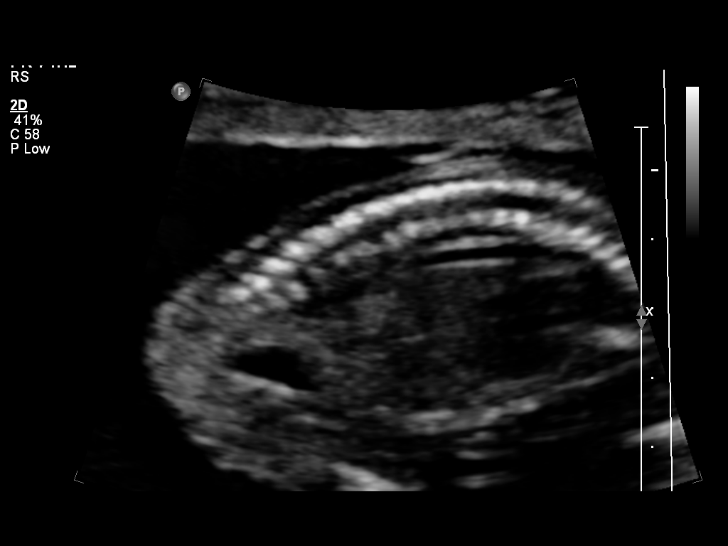

[12 of 28 positions shown; findings below may reference images not displayed]

OBSTETRICS REPORT
                      (Signed Final 04/22/2014 [DATE])

             MACK

Service(s) Provided

 US OB COMP + 14 WK                                    76805.1
Indications

 18 weeks gestation of pregnancy
 Basic anatomic survey                                 [id number]
Fetal Evaluation

 Num Of Fetuses:    1
 Fetal Heart Rate:  142                          bpm
 Cardiac Activity:  Observed
 Presentation:      Cephalic
 Placenta:          Anterior, above cervical os
 P. Cord            Visualized, central
 Insertion:

 Amniotic Fluid
 AFI FV:      Subjectively within normal limits
                                             Larg Pckt:     4.7  cm
Biometry

 BPD:       40  mm     G. Age:  18w 1d                CI:        74.23   70 - 86
                                                      FL/HC:      18.1   15.8 -
                                                                         18
 HC:     147.4  mm     G. Age:  17w 6d       29  %    HC/AC:      1.16   1.07 -

 AC:     126.8  mm     G. Age:  18w 2d       52  %    FL/BPD:
 FL:      26.7  mm     G. Age:  18w 1d       45  %    FL/AC:      21.1   20 - 24
 CER:     16.7  mm     G. Age:  16w 5d       17  %
 NFT:     3.75  mm

 Est. FW:     227  gm      0 lb 8 oz     49  %
Gestational Age

 LMP:           19w 2d        Date:  12/08/13                 EDD:   09/14/14
 U/S Today:     18w 1d                                        EDD:   09/22/14
 Best:          18w 1d     Det. By:  U/S (04/22/14)           EDD:   09/22/14
Anatomy

 Cranium:          Appears normal         Aortic Arch:      Not well visualized
 Fetal Cavum:      Appears normal         Ductal Arch:      Not well visualized
 Ventricles:       Appears normal         Diaphragm:        Appears normal
 Choroid Plexus:   Appears normal         Stomach:          Appears normal, left
                                                            sided
 Cerebellum:       Appears normal         Abdomen:          Appears normal
 Posterior Fossa:  Appears normal         Abdominal Wall:   Appears nml (cord
                                                            insert, abd wall)
 Nuchal Fold:      Appears normal         Cord Vessels:     Appears normal (3
                                                            vessel cord)
 Face:             Appears normal         Kidneys:          Appear normal
                   (orbits and profile)
 Lips:             Not well visualized    Bladder:          Appears normal
 Heart:            Not well visualized    Spine:            Appears normal
 RVOT:             Not well visualized    Lower             Appears normal
                                          Extremities:
 LVOT:             Not well visualized    Upper             Appears normal
                                          Extremities:

 Other:  Fetus appears to be a male. Heels and 5th digit previously seen.
         Nasal bone visualized. Technically difficult due to early gestational
         age.
Targeted Anatomy

 Fetal Central Nervous System
 Lat. Ventricles:  7.3                    Cisterna Magna:
Cervix Uterus Adnexa

 Cervical Length:    4.44     cm

 Cervix:       Normal appearance by transabdominal scan.
Impression

 Single IUP at 18w 1d
 Normal fetal anatomic survey
 Limited views of the fetal heart and face obtained due to fetal
 position
 No markers associated with aneuploidy noted
 Anterior placenta without previa
 Normal amniotic fluid volume
Recommendations

 Recommend follow-up ultrasound examination in 4 weeks to
 reevaluate the fetal heart and face

 LADI with us.  Please do not hesitate to
